# Patient Record
Sex: Male | Born: 1970 | Race: Black or African American | Hispanic: No | Marital: Married | Smoking: Never smoker
Health system: Southern US, Community
[De-identification: ages and names within clinical notes are randomized; demographics above are authoritative.]

## PROBLEM LIST (undated history)

## (undated) DIAGNOSIS — D573 Sickle-cell trait: Secondary | ICD-10-CM

## (undated) DIAGNOSIS — R7301 Impaired fasting glucose: Secondary | ICD-10-CM

## (undated) DIAGNOSIS — D72819 Decreased white blood cell count, unspecified: Secondary | ICD-10-CM

## (undated) DIAGNOSIS — G4730 Sleep apnea, unspecified: Secondary | ICD-10-CM

## (undated) DIAGNOSIS — I119 Hypertensive heart disease without heart failure: Secondary | ICD-10-CM

## (undated) DIAGNOSIS — I10 Essential (primary) hypertension: Secondary | ICD-10-CM

## (undated) DIAGNOSIS — N529 Male erectile dysfunction, unspecified: Secondary | ICD-10-CM

## (undated) DIAGNOSIS — E876 Hypokalemia: Secondary | ICD-10-CM

## (undated) HISTORY — DX: Sleep apnea, unspecified: G47.30

## (undated) HISTORY — DX: Sickle-cell trait: D57.3

## (undated) HISTORY — DX: Essential (primary) hypertension: I10

## (undated) HISTORY — PX: NO PAST SURGERIES: SHX2092

## (undated) HISTORY — DX: Decreased white blood cell count, unspecified: D72.819

## (undated) HISTORY — DX: Hypertensive heart disease without heart failure: I11.9

## (undated) HISTORY — DX: Hypokalemia: E87.6

## (undated) HISTORY — DX: Male erectile dysfunction, unspecified: N52.9

## (undated) HISTORY — DX: Impaired fasting glucose: R73.01

---

## 2003-08-20 ENCOUNTER — Emergency Department (HOSPITAL_COMMUNITY): Admission: EM | Admit: 2003-08-20 | Discharge: 2003-08-20 | Payer: Self-pay

## 2004-01-17 ENCOUNTER — Ambulatory Visit: Payer: Self-pay | Admitting: Internal Medicine

## 2004-01-27 ENCOUNTER — Ambulatory Visit: Payer: Self-pay | Admitting: Internal Medicine

## 2004-02-14 ENCOUNTER — Ambulatory Visit: Payer: Self-pay | Admitting: *Deleted

## 2004-11-19 ENCOUNTER — Ambulatory Visit: Payer: Self-pay | Admitting: Family Medicine

## 2004-12-15 ENCOUNTER — Ambulatory Visit: Payer: Self-pay | Admitting: Family Medicine

## 2004-12-30 ENCOUNTER — Ambulatory Visit: Payer: Self-pay | Admitting: Family Medicine

## 2005-01-27 ENCOUNTER — Ambulatory Visit: Payer: Self-pay | Admitting: Family Medicine

## 2005-03-26 ENCOUNTER — Ambulatory Visit: Payer: Self-pay | Admitting: Family Medicine

## 2005-06-04 ENCOUNTER — Ambulatory Visit: Payer: Self-pay | Admitting: Family Medicine

## 2005-06-07 ENCOUNTER — Ambulatory Visit: Payer: Self-pay | Admitting: Family Medicine

## 2007-01-30 DIAGNOSIS — E119 Type 2 diabetes mellitus without complications: Secondary | ICD-10-CM | POA: Insufficient documentation

## 2007-01-30 DIAGNOSIS — N289 Disorder of kidney and ureter, unspecified: Secondary | ICD-10-CM | POA: Insufficient documentation

## 2007-01-30 DIAGNOSIS — J3089 Other allergic rhinitis: Secondary | ICD-10-CM | POA: Insufficient documentation

## 2007-01-30 DIAGNOSIS — I10 Essential (primary) hypertension: Secondary | ICD-10-CM | POA: Insufficient documentation

## 2009-03-31 ENCOUNTER — Ambulatory Visit: Payer: Self-pay | Admitting: Hematology & Oncology

## 2009-04-09 LAB — CBC WITH DIFFERENTIAL (CANCER CENTER ONLY)
BASO%: 0.5 % (ref 0.0–2.0)
EOS%: 7.5 % — ABNORMAL HIGH (ref 0.0–7.0)
MCH: 27.7 pg — ABNORMAL LOW (ref 28.0–33.4)
MCHC: 33.9 g/dL (ref 32.0–35.9)
MONO#: 0.2 10*3/uL (ref 0.1–0.9)
NEUT#: 0.9 10*3/uL — ABNORMAL LOW (ref 1.5–6.5)
RBC: 5.25 10*6/uL (ref 4.20–5.70)
WBC: 2.4 10*3/uL — ABNORMAL LOW (ref 4.0–10.0)

## 2009-04-11 LAB — RHEUMATOID FACTOR: Rhuematoid fact SerPl-aCnc: <20 IU/mL (ref 0–20)

## 2009-04-11 LAB — VITAMIN B12: Vitamin B-12: 742 pg/mL (ref 211–911)

## 2009-04-11 LAB — ANTI-NUCLEAR AB-TITER (ANA TITER): ANA Titer 1: NEGATIVE

## 2011-11-03 ENCOUNTER — Telehealth: Payer: Self-pay | Admitting: Hematology & Oncology

## 2011-11-03 NOTE — Telephone Encounter (Signed)
Received call from referring Patient needs to be seen again. She will fax labs to [place]. I left message on cell for patient to call for appointment and gave her 11-15-11 date

## 2011-11-15 ENCOUNTER — Telehealth: Payer: Self-pay | Admitting: Hematology & Oncology

## 2011-11-15 ENCOUNTER — Encounter: Payer: Self-pay | Admitting: Hematology & Oncology

## 2011-11-15 ENCOUNTER — Other Ambulatory Visit: Payer: Self-pay | Admitting: Lab

## 2011-11-15 NOTE — Telephone Encounter (Signed)
Pt aware of 12-03-11 appointment

## 2011-11-26 ENCOUNTER — Ambulatory Visit: Payer: Self-pay | Admitting: Hematology & Oncology

## 2011-11-26 ENCOUNTER — Other Ambulatory Visit: Payer: Self-pay | Admitting: Lab

## 2011-12-03 ENCOUNTER — Ambulatory Visit (HOSPITAL_BASED_OUTPATIENT_CLINIC_OR_DEPARTMENT_OTHER): Payer: BC Managed Care – PPO | Admitting: Medical

## 2011-12-03 ENCOUNTER — Telehealth: Payer: Self-pay | Admitting: Hematology & Oncology

## 2011-12-03 ENCOUNTER — Other Ambulatory Visit (HOSPITAL_BASED_OUTPATIENT_CLINIC_OR_DEPARTMENT_OTHER): Payer: BC Managed Care – PPO | Admitting: Lab

## 2011-12-03 VITALS — BP 129/77 | HR 58 | Temp 97.7°F | Ht 70.0 in | Wt 165.0 lb

## 2011-12-03 DIAGNOSIS — D72819 Decreased white blood cell count, unspecified: Secondary | ICD-10-CM

## 2011-12-03 LAB — CBC WITH DIFFERENTIAL (CANCER CENTER ONLY)
EOS%: 1.1 % (ref 0.0–7.0)
MCH: 27.7 pg — ABNORMAL LOW (ref 28.0–33.4)
MCHC: 34.3 g/dL (ref 32.0–35.9)
NEUT#: 1.3 10*3/uL — ABNORMAL LOW (ref 1.5–6.5)
NEUT%: 48 % (ref 40.0–80.0)
Platelets: 177 10*3/uL (ref 145–400)
RBC: 5.23 10*6/uL (ref 4.20–5.70)
RDW: 13.2 % (ref 11.1–15.7)
WBC: 2.8 10*3/uL — ABNORMAL LOW (ref 4.0–10.0)

## 2011-12-03 LAB — CHCC SATELLITE - SMEAR

## 2011-12-03 NOTE — Telephone Encounter (Signed)
Pt called and change 12/03/11 appt time from 11am to 3pm.  RN was notified of appt time change

## 2011-12-06 NOTE — Progress Notes (Signed)
Patient Name : Daniel Barrera, Daniel Barrera MR #[id number] DOB: Oct 13, 1970 Encounter Date: 12/06/2011 Dictated by Eunice Blase, PA-C  Diagnoses: #1 leukopenia. #2 sickle cell trait.  Current therapy observation.  Interim history: Mr. Goyne was last seen back on 04/09/2009.  He presents today for an office followup visit.  Overall, he reports, that he's been doing very well.  We last saw him for leukopenia at which time his white count was 2.4 , hemoglobin 14.5, hematocrit 42.9, platelets 170,000 , his differential showed neutrophils 36.8 percent, lymphocytes, 48%, monocytes, 7.2%, eosinophils 7.5.  Overall, he's been feeling fairly well.  He has no complaints.  He's not having any problems with any recent or recurrent infections.  He's been checked for hepatitis, and HIV.  He is not reporting any rashes, joint aches or pains.  He does not report any change in bowel or bladder, habits.  He's not having any fevers, chills, or night sweats.  He does not report any unintentional weight loss or weight gain.  He does not report any nausea, vomiting, chest pain, shortness of breath.  Obvious, bleeding or swollen lymph nodes.  He has a good appetite and is eating well.  When we saw him last.  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  He does report that he has the sickle cell trait, which is a possibility.  He does not have any degree of anemia, his MCV is 81.  I'm not sure if, in the past.  He had a hemoglobin, electrophoresis to confirm that he truly does have sickle cell trait.  Overall, Mr. Steers reports, that he's been feeling fine without any problems.  Review of Systems: Pt. Denies any changes in their vision, hearing, adenopathy, fevers, chills, nausea, vomiting, diarrhea, constipation, chest pain, shortness of breath, passing blood, passing out, blacking out,  any changes in skin, joints, neurologic or psychiatric except as  noted.  Physical Exam: This is a pleasant, 41 year old, African American, male, in no obvious distress Vitals: Temperature 97.7 degrees, pulse 58, respirations 18, blood pressure 129/77, weight 165 pounds HEENT reveals a normocephalic, atraumatic skull, no scleral icterus, no oral lesions  Neck is supple without any cervical or supraclavicular adenopathy.  Lungs are clear to auscultation bilaterally. There are no wheezes, rales or rhonci Cardiac is regular rate and rhythm with a normal S1 and S2. There are no murmurs, rubs, or bruits.  Abdomen is soft with good bowel sounds there's a palpable mass. There is no palpable hepatosplenomegaly. There is no palpable fluid wave.  Musculoskeletal no tenderness of the spine, ribs, or hips.  Extremities there are no clubbing, cyanosis, or edema.  Skin no petechia, purpura or ecchymosis Neurologic is nonfocal.  Laboratory Data: White count 2.8, hemoglobin 14.5, hematocrit 42.3, platelets 177,000, neutrophils 1.3, lymphocytes, 1.1, monos 0.3, eosinophils, 0.0 basophil 0.0  Current Outpatient Prescriptions on File Prior to Visit  Medication Sig Dispense Refill  . EXFORGE 10-320 MG per tablet daily.       . hydrALAZINE (APRESOLINE) 50 MG tablet 2 (two) times daily.         Assessment/Plan: This is a pleasant, 41 year old, African American gentleman, with the following issues. #1 leukopenia-this really has not caused him any problems.  He is asymptomatic with this.  He has been checked for hepatitis, and HIV.  His immune system seems to be protecting him adequately at this time.  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  I do not see a need for a bone marrow test at the present time.  We will continue with observation.  #2 sickle cell trait-he does not have any degree of anemia, although his MCV is 81.  I suppose at some point down the road, a hemoglobin, electrophoresis.  May need to be done to confirm  this.  #3 followup-Mr. Hindes will follow back up with [place] in one year, but before then should there be questions or concerns.

## 2012-08-14 ENCOUNTER — Ambulatory Visit (INDEPENDENT_AMBULATORY_CARE_PROVIDER_SITE_OTHER): Payer: BC Managed Care – PPO | Admitting: Pulmonary Disease

## 2012-08-14 ENCOUNTER — Telehealth: Payer: Self-pay | Admitting: Pulmonary Disease

## 2012-08-14 ENCOUNTER — Encounter: Payer: Self-pay | Admitting: Pulmonary Disease

## 2012-08-14 VITALS — BP 128/62 | HR 61 | Temp 97.9°F | Ht 70.0 in | Wt 159.2 lb

## 2012-08-14 DIAGNOSIS — G4733 Obstructive sleep apnea (adult) (pediatric): Secondary | ICD-10-CM

## 2012-08-14 DIAGNOSIS — G4710 Hypersomnia, unspecified: Secondary | ICD-10-CM

## 2012-08-14 NOTE — Assessment & Plan Note (Signed)
The patient apparently has a diagnosis of obstructive sleep apnea, and is having persistent restless sleep and daytime sleepiness despite wearing CPAP.  Unfortunately, I do not have his studies available for review.  It is unclear whether we are adequately treating his sleep apnea, whether he may be having pressure induced central apneas, or whether his sleep issues have nothing to do with his sleep apnea.  He is describing difficulties with sleep onset, and perhaps this is the major issue.  He apparently has had a multiple sleep latency test, and we'll need to get the results of this as well.  Unfortunately, this is not an accurate study for narcolepsy or idiopathic hypersomnia if we are not treating his sleep apnea adequately, or if he is having prolonged periods of awakening at night.  We'll await the review of his sleep studies prior to making a decision.  More than likely will start with an automatic CPAP device for a 2 to three-week evaluation.

## 2012-08-14 NOTE — Progress Notes (Signed)
Subjective:    Patient ID: Daniel Barrera, male    DOB: 15-May-1971, 42 y.o.   MRN: [id number]  HPI  The patient is a 42 year old male who I have been asked to see for restless sleep and persistent non-restorative sleep.  He was diagnosed by his history last year with significant obstructive sleep apnea, however his study is not available for my review.  He underwent a titration study, and was started on CPAP at optimal pressure.  He uses a nasal CPAP mask, and denies an issue with mouth opening.  He has had issues with mask fitting.  Despite wearing CPAP, he has continued to have issues with sleep onset, and it may take up to 3 hours for him to fall asleep.  He will awaken 3-4 times a night, and does not feel rested in the mornings upon arising.  He has had persistent daytime sleepiness that greatly interferes with his work efficiency and quality of life.  He thinks the sleepiness and restless sleep has been going on for about 15 years, but denies this being an issue in young adulthood.  He has had a multiple sleep latency test, with the results being unknown.  He did not have as much of an issue with sleep onset prior to wearing CPAP.  He did have snoring, but it is unknown if he had an abnormal breathing pattern during sleep prior to CPAP.  He states that he has had no change in his weight over the last 2 years, and his Epworth score today is 7.  Sleep Questionnaire What time do you typically go to bed?( Between what hours) 9-10pm 9-10pm at 1151 on 08/14/12 by Nita Sells, CMA How long does it take you to fall asleep? 3hrs at times---pt states that some times he does not sleep at all during the night.  3hrs at times---pt states that some times he does not sleep at all during the night.  at 1151 on 08/14/12 by Nita Sells, CMA How many times during the night do you wake up? 4 4 at 1151 on 08/14/12 by Nita Sells, CMA What time do you get out of bed to start your day? 0700 0700 at 1151 on  08/14/12 by Nita Sells, CMA Do you drive or operate heavy machinery in your occupation? No No at 1151 on 08/14/12 by Nita Sells, CMA How much has your weight changed (up or down) over the past two years? (In pounds) 0 oz (0 kg) 0 oz (0 kg) at 1151 on 08/14/12 by Nita Sells, CMA Have you ever had a sleep study before? Yes Yes at 1151 on 08/14/12 by Nita Sells, CMA If yes, location of study? [facility] [place] [place] [facility] [place] [place] at 1151 on 08/14/12 by Nita Sells, CMA If yes, date of study? nov 2013 nov 2013 at 1151 on 08/14/12 by Nita Sells, CMA Do you currently use CPAP? Yes Yes at 1151 on 08/14/12 by Marjo Bicker Mabe, CMA If so, what pressure? 2 2 at 1151 on 08/14/12 by Marjo Bicker Mabe, CMA Do you wear oxygen at any time? No No at 1151 on 08/14/12 by Marjo Bicker Mabe, CMA   Review of Systems  Constitutional: Negative for fever and unexpected weight change.  HENT: Positive for nosebleeds ( patient states with sneezing and increased secretions with bloody mucus with CPAP) and sneezing. Negative for ear pain, congestion, sore throat, rhinorrhea, trouble swallowing, dental problem, postnasal  drip and sinus pressure.   Eyes: Negative for redness and itching.  Respiratory: Positive for shortness of breath. Negative for cough, chest tightness and wheezing.   Cardiovascular: Negative for palpitations and leg swelling.  Gastrointestinal: Negative for nausea and vomiting.  Genitourinary: Negative for dysuria.  Musculoskeletal: Negative for joint swelling.  Skin: Negative for rash.  Neurological: Negative for headaches.  Hematological: Does not bruise/bleed easily.  Psychiatric/Behavioral: Negative for dysphoric mood. The patient is not nervous/anxious.        Objective:   Physical Exam Constitutional:  Thin male, no acute distress  HENT:  Nares patent without discharge, mild crusting on right, deviated septum to left with narrowing.  Oropharynx  without exudate, palate and uvula are elongated, large tongue.   Eyes:  Perrla, eomi, no scleral icterus  Neck:  No JVD, no TMG  Cardiovascular:  Normal rate, regular rhythm, no rubs or gallops.  No murmurs        Intact distal pulses  Pulmonary :  Normal breath sounds, no stridor or respiratory distress   No rales, rhonchi, or wheezing  Abdominal:  Soft, nondistended, bowel sounds present.  No tenderness noted.   Musculoskeletal:  No lower extremity edema noted.  Lymph Nodes:  No cervical lymphadenopathy noted  Skin:  No cyanosis noted  Neurologic:  Alert, appropriate, moves all 4 extremities without obvious deficit.         Assessment & Plan:

## 2012-08-14 NOTE — Telephone Encounter (Signed)
lmtcb x1 for pt. 

## 2012-08-14 NOTE — Patient Instructions (Addendum)
Will discuss your sleep study results and further plans once I receive your data Work on the behavioral therapies we discussed to try and help sleep onset issues.

## 2012-08-15 NOTE — Telephone Encounter (Signed)
Spoke with Daniel Barrera and she said that they have already gotten copies of sleep studies and this is taken care of.

## 2012-08-23 ENCOUNTER — Telehealth: Payer: Self-pay | Admitting: Pulmonary Disease

## 2012-08-23 ENCOUNTER — Other Ambulatory Visit: Payer: Self-pay | Admitting: Pulmonary Disease

## 2012-08-23 ENCOUNTER — Encounter: Payer: Self-pay | Admitting: Pulmonary Disease

## 2012-08-23 DIAGNOSIS — G4733 Obstructive sleep apnea (adult) (pediatric): Secondary | ICD-10-CM

## 2012-08-23 NOTE — Telephone Encounter (Signed)
Please let pt know that we received his last 2 sleep studies, but still have not seen original.  Will try to get this.  In the meantime, would like to put him on auto device for 3 weeks to better understand what is going on with his sleep apnea.  Then will analyze the download.  I have sent an order to [facility].

## 2012-08-24 NOTE — Telephone Encounter (Signed)
Patient aware of results per [facility].  Patient c/o nose bleeds--per KC patient to increase humidity of device and let us know if the nose bleeds do not subside.  Explained to patient that I am currently working on getting his ORIGINAL sleep study from [facility]--once received Dr Shelle Iron will review then someone will contact him. Nothing further needed.

## 2012-08-29 ENCOUNTER — Other Ambulatory Visit: Payer: Self-pay | Admitting: Pulmonary Disease

## 2012-08-29 ENCOUNTER — Encounter: Payer: Self-pay | Admitting: Pulmonary Disease

## 2012-08-29 DIAGNOSIS — G4733 Obstructive sleep apnea (adult) (pediatric): Secondary | ICD-10-CM

## 2012-08-29 NOTE — Telephone Encounter (Signed)
LMOM x1 Sleep Study results received from [facility]---called to let pt know these have been received as well as recs per KC.  Patient needs to be made aware that order has been sent to [facility] for auto setting x 3 weeks to optimize pressure.

## 2012-08-29 NOTE — Telephone Encounter (Signed)
Original Sleep Study has been received and given to [facility] to review.   Please advise, pt still awaiting our call. Thanks.

## 2012-08-29 NOTE — Telephone Encounter (Signed)
Let him know that I have sent an order to [facility] to have his equipment company put hin on an auto setting for 3 weeks to optimize his pressure.  His current pressure seems low for the degree of his sleep apnea.  Will call him once I see his 3 week download.

## 2012-09-07 ENCOUNTER — Telehealth: Payer: Self-pay | Admitting: Pulmonary Disease

## 2012-09-07 NOTE — Telephone Encounter (Signed)
Pt states that CPAP machine pressure seems to be changing--different every time he uses it. I looked into his settings and he is on AUTO--I explained to him that this is why he is feeling the pressure constantly change. Pt states that he is having a hard time tolerating this constant change and now he is beginning to have multiple problems.   Pt c/o nasal congestion, nosebleeds, back pain and chest pain---pt state that this only happens when wearing the CPAP. I offered pt an appt with MW since he was having so much discomfort (chest pain and back pain) but pt refused--wants to wait on Dr Shelle Iron to help him tomorrow. I explained to pt that Dr Shelle Iron will receive this message int the AM and someone will call him back.   Dr Shelle Iron please advise. Thanks.

## 2012-09-08 ENCOUNTER — Other Ambulatory Visit: Payer: Self-pay | Admitting: Pulmonary Disease

## 2012-09-08 DIAGNOSIS — G4733 Obstructive sleep apnea (adult) (pediatric): Secondary | ICD-10-CM

## 2012-09-08 NOTE — Telephone Encounter (Signed)
lmomtcb x1 for pt 

## 2012-09-08 NOTE — Telephone Encounter (Signed)
Let pt know to be sure and turn up heat on humidifier if he is not getting enough moisture which can dry out nose and cause bleeding.  Also, get some nasal saline spray at drugstore, and use all during the day to keep nose moist.   Regarding his pressure cycling, will send order to dme to get download off his machine to see if we can get set on his optimal pressure.  Try to stay with auto for a little longer until this can be done.  Regarding his back and chest discomfort, he should see his primary md to see what is going on.

## 2012-09-11 NOTE — Telephone Encounter (Signed)
Spoke with patient, made him aware of recs as listed below per Dr. Shelle Iron. Verbalized understanding and nothing further needed at this time.

## 2012-10-02 ENCOUNTER — Telehealth: Payer: Self-pay | Admitting: Pulmonary Disease

## 2012-10-02 NOTE — Telephone Encounter (Signed)
Please advise if you have reviewed CPAP download thanks

## 2012-10-02 NOTE — Telephone Encounter (Signed)
Requested D/L from [facility]. Will give to [facility] once received.

## 2012-10-02 NOTE — Telephone Encounter (Signed)
Have not seen his downloads.

## 2012-10-02 NOTE — Telephone Encounter (Signed)
Will forward to [place] to track down results per triage protocol

## 2012-10-03 NOTE — Telephone Encounter (Signed)
lmomtcb x1 for pt 

## 2012-10-03 NOTE — Telephone Encounter (Signed)
Please let pt know that I have gotten all of his records, and have reviewed his download.  This shows great control of his sleep apnea with a pressure of 9-10cm.  His home machine is apparently set at 10, and should be adequate.  He has no mask leak.  I think his sleep apnea is being well controlled, and probably isn't the cause of his restless sleep.  Would look into sleep environment and see if anything needs to be addressed ( sounds, light, mattress, pillow, bed partner, pets, tv, etc.)  Would not change anything with his cpap if his machine is set on 10.

## 2012-10-03 NOTE — Telephone Encounter (Signed)
Form received and placed in Dr Shelle Iron folder for review.

## 2012-10-04 NOTE — Telephone Encounter (Signed)
lmomtcb on pt's home # I called pt's cell #, spoke with him.  States he is currently in a msg.  He would like to call back when he gets out of this.   Will await call back.

## 2012-10-05 NOTE — Telephone Encounter (Signed)
Pt advised. Jennifer Castillo, CMA  

## 2012-10-19 ENCOUNTER — Telehealth: Payer: Self-pay | Admitting: Pulmonary Disease

## 2012-10-19 DIAGNOSIS — G4733 Obstructive sleep apnea (adult) (pediatric): Secondary | ICD-10-CM

## 2012-10-19 NOTE — Telephone Encounter (Signed)
I spoke with pt and he stated he is feeling very tired/sleepy during the day. He has done everything KC has recommended. No TV in bed and all the lights are OFF. He wears his CPAP every night for as along as he sleeps. He believes the pressure is set at 10. He is requesting further recs. Please advise KC thanks

## 2012-10-20 NOTE — Telephone Encounter (Signed)
Spoke with patient-- patient has been scheduled to see Dr. Shelle Iron July 11 @ 130pm (after 4 week download) Patient has phillips cpap and is serviced with [facility] Patient aware order has been placed for 4 week download and patient aware appt is 4 weeks out so this download can be done and received by Dr. Shelle Iron. Nothing further needed at this time

## 2012-10-20 NOTE — Telephone Encounter (Signed)
I would like to see pt back in office to discuss with him Have him bring his machine so we can do a download.  If he does not have a resmed machine, will need to have his dme get a 4 week download off his machine and send to [place] before the visit.

## 2012-11-06 ENCOUNTER — Telehealth: Payer: Self-pay | Admitting: Pulmonary Disease

## 2012-11-06 NOTE — Telephone Encounter (Signed)
LMTCB for the pt 

## 2012-11-07 NOTE — Telephone Encounter (Signed)
Spoke with pt and he reports still having trouble falling asleep and going back to sleep.  He reports that he only used download machine for 1 week and then stopped due to nosebleeds.  Instructed pt that Dr Shelle Iron needs to have that complete 4 week download to help assess his pressure needs.  Pt states that he will retry the download machine.  Spoke with Larita Fife at [facility] and she will have therapist call pt to see if they can help him with nosebleed issues.  Pt will call our office and reschedule ov with Dr Shelle Iron for after the download is complete.

## 2012-11-24 ENCOUNTER — Telehealth: Payer: Self-pay | Admitting: Pulmonary Disease

## 2012-11-24 ENCOUNTER — Ambulatory Visit: Payer: BC Managed Care – PPO | Admitting: Pulmonary Disease

## 2012-11-24 NOTE — Telephone Encounter (Signed)
I spoke with the pt and he wanted to know why he was contacted about an appt today when it was supposed to be cancelled. I apologized and advised the appt had not been cancelled so that is why he received the call. I assured him that the appt has been cancelled and he will not be charged. The pt is using his cpap so we can get the 4 week download. He will call once the download is done to set an appt. Carron Curie, CMA

## 2012-12-01 ENCOUNTER — Encounter: Payer: Self-pay | Admitting: Hematology & Oncology

## 2012-12-01 ENCOUNTER — Ambulatory Visit (HOSPITAL_BASED_OUTPATIENT_CLINIC_OR_DEPARTMENT_OTHER): Payer: BC Managed Care – PPO | Admitting: Hematology & Oncology

## 2012-12-01 ENCOUNTER — Other Ambulatory Visit (HOSPITAL_BASED_OUTPATIENT_CLINIC_OR_DEPARTMENT_OTHER): Payer: BC Managed Care – PPO | Admitting: Lab

## 2012-12-01 VITALS — BP 127/59 | HR 59 | Temp 98.7°F | Resp 18 | Ht 70.0 in | Wt 160.0 lb

## 2012-12-01 DIAGNOSIS — D72819 Decreased white blood cell count, unspecified: Secondary | ICD-10-CM

## 2012-12-01 HISTORY — DX: Decreased white blood cell count, unspecified: D72.819

## 2012-12-01 LAB — CBC WITH DIFFERENTIAL (CANCER CENTER ONLY)
EOS%: 0.5 % (ref 0.0–7.0)
HCT: 43.7 % (ref 38.7–49.9)
HGB: 14.7 g/dL (ref 13.0–17.1)
LYMPH#: 0.9 10*3/uL (ref 0.9–3.3)
LYMPH%: 39.1 % (ref 14.0–48.0)
MCHC: 33.6 g/dL (ref 32.0–35.9)
MCV: 81 fL — ABNORMAL LOW (ref 82–98)
MONO%: 14.1 % — ABNORMAL HIGH (ref 0.0–13.0)
NEUT%: 45.8 % (ref 40.0–80.0)
Platelets: 169 10*3/uL (ref 145–400)
RBC: 5.39 10*6/uL (ref 4.20–5.70)
RDW: 13.1 % (ref 11.1–15.7)

## 2012-12-01 LAB — COMPREHENSIVE METABOLIC PANEL
CO2: 28 mEq/L (ref 19–32)
Calcium: 9.7 mg/dL (ref 8.4–10.5)
Glucose, Bld: 105 mg/dL — ABNORMAL HIGH (ref 70–99)
Total Bilirubin: 0.4 mg/dL (ref 0.3–1.2)

## 2012-12-01 NOTE — Progress Notes (Signed)
This office note has been dictated.

## 2012-12-02 NOTE — Progress Notes (Signed)
CC:   Jackie Plum, M.D.  DIAGNOSES: 1. 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 2. Sickle cell trait. 3. Sleep apnea.  CURRENT THERAPY:  Observation.  INTERIM HISTORY:  Daniel Barrera comes in for a followup.  We last saw him a year ago.  He has been doing okay outside of the fact that his sleep apnea machine is not working right.  As such, he has not been able to sleep well.  His changes in medications since we last saw him include the fact that he is no longer on Exforge.  He is only taking Apresoline.  He has had no bleeding.  He has had no nausea or vomiting.  He has had no rashes.  He has had no fever.  He got through the wintertime without any kind of infections.  PHYSICAL EXAM:  General:  This is a well-developed, well-nourished African American gentleman in no obvious distress.  Vital Signs: Temperature of 98.7, pulse 59, respiratory rate 18, blood pressure 127/59.  Weight is 160.  Head and Neck:  Show a normocephalic, atraumatic skull.  There are no ocular or oral lesions.  There are no palpable cervical or supraclavicular lymph nodes.  Lungs:  Clear bilaterally.  Cardiac:  Regular rate and rhythm with a normal S1, S2. There are no murmurs, rubs, or bruits.  Abdomen:  Soft with good bowel sounds.  There is no palpable abdominal mass.  There is no palpable hepatosplenomegaly.  Extremities:  Show no clubbing, cyanosis, or edema. Skin:  Shows no rashes, ecchymoses, or petechia.  LABORATORY STUDIES:  White cell count is 2.2.  Hemoglobin 14.7, hematocrit 43.7, platelet count 169.  MCV is 81.  White cell differential shows 46 segs, 39 lymphs, 14 monos.  Peripheral smear shows good maturation of his white blood cells.  I do not see any immature myeloid or lymphoid cells.  There are no atypical lymphocytes.  He has no nucleated red blood cells.  There might be a couple target cells.  I see no rouleaux formation.  IMPRESSION:  Daniel Barrera is a 42 year old African American  gentleman.  He has leukopenia.  This could be multifactorial.  He is on hydralazine which could cause some leukopenia.  He is also ANA positive.  Again, this might be an effect from the hydralazine.  I think most of the leukopenia is ethnic associated.  He is from [place].  I do not see any problems with respect to a hematologic malignancy given his blood smear looking so "bland."  I do want to see him back in about 3 months' time.  I think that if the white cell count continues to drop, then we are going to have to do a bone marrow on him.    ______________________________ Josph Macho, M.D. PRE/MEDQ  D:  12/01/2012  T:  12/02/2012  Job:  [id number]

## 2013-01-22 ENCOUNTER — Telehealth: Payer: Self-pay | Admitting: Pulmonary Disease

## 2013-01-22 NOTE — Telephone Encounter (Signed)
I spoke with pt. He needed to come in and see KC to discuss his sleep. He is schedule to see [facility] tomorrow 10:45. Nothing further needed

## 2013-01-23 ENCOUNTER — Ambulatory Visit (INDEPENDENT_AMBULATORY_CARE_PROVIDER_SITE_OTHER): Payer: BC Managed Care – PPO | Admitting: Pulmonary Disease

## 2013-01-23 ENCOUNTER — Encounter: Payer: Self-pay | Admitting: Pulmonary Disease

## 2013-01-23 VITALS — BP 120/72 | HR 60 | Temp 99.0°F | Ht 71.0 in | Wt 165.8 lb

## 2013-01-23 DIAGNOSIS — G4700 Insomnia, unspecified: Secondary | ICD-10-CM

## 2013-01-23 DIAGNOSIS — F5104 Psychophysiologic insomnia: Secondary | ICD-10-CM

## 2013-01-23 DIAGNOSIS — G4733 Obstructive sleep apnea (adult) (pediatric): Secondary | ICD-10-CM

## 2013-01-23 MED ORDER — TRAZODONE HCL 50 MG PO TABS: ORAL_TABLET | ORAL | Status: DC

## 2013-01-23 NOTE — Progress Notes (Signed)
  Subjective:    Patient ID: Daniel Barrera, male    DOB: 1970/08/15, 42 y.o.   MRN: [id number]  HPI The patient comes in today for followup of his multiple sleep issues.  He has severe obstructive sleep apnea as well as chronic insomnia, and it has been difficult to get him treated.  He currently is on CPAP with an automatic setting, and has downloaded today shows a good effort to try and wear her CPAP consistently.  There were no significant mask leaks, and no significant breakthrough events.  The patient continues to have issues with sleep onset as well as maintenance, but has not stuck with the behavioral therapy that I have outlined on multiple visits.  I have stressed to him again the importance of this.   Review of Systems  Constitutional: Positive for fatigue. Negative for fever and unexpected weight change.  HENT: Negative for ear pain, nosebleeds, congestion, sore throat, rhinorrhea, sneezing, trouble swallowing, dental problem, postnasal drip and sinus pressure.   Eyes: Negative for redness and itching.  Respiratory: Negative for cough, chest tightness, shortness of breath and wheezing.   Cardiovascular: Negative for palpitations and leg swelling.  Gastrointestinal: Negative for nausea and vomiting.  Genitourinary: Negative for dysuria.  Musculoskeletal: Negative for joint swelling.  Skin: Negative for rash.  Neurological: Negative for headaches.  Hematological: Does not bruise/bleed easily.  Psychiatric/Behavioral: Negative for dysphoric mood. The patient is not nervous/anxious.        Objective:   Physical Exam Thin male in no acute distress Nose without purulence or discharge noted No skin breakdown or pressure necrosis from the CPAP mask Neck without lymphadenopathy or thyromegaly Lower extremities without edema, no cyanosis The patient appears very sleepy, but appropriate.  Moves all 4 extremities without deficit       Assessment & Plan:

## 2013-01-23 NOTE — Assessment & Plan Note (Signed)
The patient has long-standing chronic insomnia, and unfortunately did not stick to the behavioral therapies that I have outlined that multiple visits.  I am willing to give him a short trial of trazodone so that he can wear CPAP consistently while trying to sleep.  Hopefully this will break the cycle of his insomnia.  However, I have stressed to him that he must do the behavioral therapy on a consistent basis in order to improve.  If he continues to have issues, I would recommend referral to a behavioral therapist for CBT.

## 2013-01-23 NOTE — Addendum Note (Signed)
Addended by: Maisie Fus on: 01/23/2013 11:33 AM   Modules accepted: Orders

## 2013-01-23 NOTE — Patient Instructions (Addendum)
Stay on cpap, but will have your pressure changed to a fixed pressure that may be more comfortable for you. Will try trazodone 50mg  about an hour before bedtime for 2 weeks only.  But remember, you still need to work on the behavioral therapies we discussed or this will not improve. If you continue to have issues with sleep onset, will make a recommendation to your primary doctor to refer to behavioral therapist for CBT (cognitive behavioral therapy). Please call me in 2-3 weeks with your response to the trazodone.

## 2013-01-23 NOTE — Assessment & Plan Note (Signed)
The patient has a history of severe obstructive sleep apnea, and at least by his download is making an effort to wear her almost every night.  He does not like the auto pressure setting, and we'll therefore put on a fixed pressure based on his download.  I have explained to the patient that his insomnia will not resolve that we did not adequately treat his sleep apnea.

## 2013-02-12 NOTE — Progress Notes (Signed)
This encounter was created in error - please disregard.

## 2013-03-02 ENCOUNTER — Ambulatory Visit: Payer: BC Managed Care – PPO | Admitting: Hematology & Oncology

## 2013-03-02 ENCOUNTER — Other Ambulatory Visit: Payer: BC Managed Care – PPO | Admitting: Lab

## 2013-03-22 ENCOUNTER — Other Ambulatory Visit: Payer: Self-pay

## 2013-05-18 ENCOUNTER — Telehealth: Payer: Self-pay | Admitting: Hematology & Oncology

## 2013-05-18 NOTE — Telephone Encounter (Signed)
Pt called made 1-22 appointment from oct no show. He wanted to be seen today, I transferred to RN and told pt to leave message if they don't answer.

## 2013-06-07 ENCOUNTER — Telehealth: Payer: Self-pay | Admitting: Hematology & Oncology

## 2013-06-07 ENCOUNTER — Ambulatory Visit (HOSPITAL_BASED_OUTPATIENT_CLINIC_OR_DEPARTMENT_OTHER): Payer: BC Managed Care – PPO | Admitting: Hematology & Oncology

## 2013-06-07 ENCOUNTER — Encounter: Payer: Self-pay | Admitting: Hematology & Oncology

## 2013-06-07 ENCOUNTER — Other Ambulatory Visit (HOSPITAL_BASED_OUTPATIENT_CLINIC_OR_DEPARTMENT_OTHER): Payer: BC Managed Care – PPO | Admitting: Lab

## 2013-06-07 VITALS — BP 138/72 | HR 77 | Temp 98.2°F | Resp 18 | Ht 69.0 in | Wt 167.0 lb

## 2013-06-07 DIAGNOSIS — D72819 Decreased white blood cell count, unspecified: Secondary | ICD-10-CM

## 2013-06-07 DIAGNOSIS — D573 Sickle-cell trait: Secondary | ICD-10-CM

## 2013-06-07 HISTORY — DX: Sickle-cell trait: D57.3

## 2013-06-07 LAB — CBC WITH DIFFERENTIAL (CANCER CENTER ONLY)
BASO#: 0 10*3/uL (ref 0.0–0.2)
BASO%: 0.4 % (ref 0.0–2.0)
EOS%: 1.6 % (ref 0.0–7.0)
Eosinophils Absolute: 0 10*3/uL (ref 0.0–0.5)
HCT: 40.6 % (ref 38.7–49.9)
HEMOGLOBIN: 13.5 g/dL (ref 13.0–17.1)
LYMPH#: 0.8 10*3/uL — AB (ref 0.9–3.3)
LYMPH%: 33.1 % (ref 14.0–48.0)
MCH: 26.9 pg — AB (ref 28.0–33.4)
MCHC: 33.3 g/dL (ref 32.0–35.9)
MCV: 81 fL — ABNORMAL LOW (ref 82–98)
MONO#: 0.3 10*3/uL (ref 0.1–0.9)
MONO%: 12.9 % (ref 0.0–13.0)
NEUT%: 52 % (ref 40.0–80.0)
NEUTROS ABS: 1.3 10*3/uL — AB (ref 1.5–6.5)
Platelets: 169 10*3/uL (ref 145–400)
RBC: 5.02 10*6/uL (ref 4.20–5.70)
RDW: 13.4 % (ref 11.1–15.7)
WBC: 2.5 10*3/uL — ABNORMAL LOW (ref 4.0–10.0)

## 2013-06-07 NOTE — Progress Notes (Signed)
This office note has been dictated.

## 2013-06-07 NOTE — Telephone Encounter (Signed)
Per inbasket schedule follow up in 6 months (7-23) pt scheduled for 8-4

## 2013-06-09 NOTE — Progress Notes (Signed)
CC:   Jackie PlumGeorge Barrera, M.D.  DIAGNOSES: 1. 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 2. Sickle cell trait.  CURRENT THERAPY:  Observation.  INTERIM HISTORY:  Mr. Daniel Barrera comes in for followup.  We last saw him back in July.  Since then, he has been doing well.  He was in [place] for 4 months for a training program.  He does work for [employer].  He has had no problems with bleeding or bruising.  He has had no fever. He has had no issues with sickle cell trait.  PHYSICAL EXAMINATION:  General:  This is a well-developed, well- nourished African American gentleman in no obvious distress.  Vital Signs:  Temperature of 98.2, pulse 77, respiratory rate 18, blood pressure 138/72, and weight is 167 pounds.  Head and Neck: Normocephalic, atraumatic skull.  There are no ocular or oral lesions. There are no palpable cervical or supraclavicular lymph nodes.  Lungs: Clear bilaterally.  Cardiac:  Regular rate and rhythm with a normal S1 and S2.  There are no murmurs, rubs, or bruits.  Abdomen:  Soft.  He has good bowel sounds.  There is no fluid wave.  There is no palpable abdominal mass.  There is no palpable hepatosplenomegaly.  Back:  No tenderness over the spine, ribs, or hips.  Extremities:  No clubbing, cyanosis, or edema.  Neurological:  No focal neurological deficits.  LABORATORY STUDIES:  White cell count is 2.5, hemoglobin 13.5, hematocrit 40.6, platelet count 169.  MCV is 81.  White cell differential shows 52 segs, 33 lymphocytes, 13 monos.  Peripheral smear shows some mild anisocytosis and poikilocytosis.  He has no nucleated red cells.  There may be a rare target cell.  I saw no rouleaux formation.  There was no polychromasia.  White cells appear normal in morphology and maturation.  He may have a slight decrease in the number of white cells.  They appear to be mature.  There are no hypersegmented polys.  I see no atypical lymphocytes.  Platelets are adequate in  number and size.  IMPRESSION:  Mr. Daniel Barrera is a nice 43 year old African American gentleman.  He has leukopenia.  I have to believe that this is ethnic- associated leukopenia.  He also has sickle cell trait.  I told him to make sure he takes folic acid.  I told him to take over-the-counter folic acid.  Again, I do not see any issues with his leukopenia.  He is asymptomatic with it.  I do not think this will ever cause him any problems.  I reassured them of this.  He wants to come back to see us in 6 months.  I think this would be reasonable.    ______________________________ [name], M.D. PRE/MEDQ  D:  06/08/2013  T:  06/09/2013  Job:  [id number]

## 2013-06-27 ENCOUNTER — Ambulatory Visit: Payer: BC Managed Care – PPO | Admitting: Pulmonary Disease

## 2013-06-28 ENCOUNTER — Encounter: Payer: Self-pay | Admitting: Pulmonary Disease

## 2013-06-28 ENCOUNTER — Ambulatory Visit (INDEPENDENT_AMBULATORY_CARE_PROVIDER_SITE_OTHER): Payer: BC Managed Care – PPO | Admitting: Pulmonary Disease

## 2013-06-28 VITALS — BP 138/62 | HR 79 | Temp 98.3°F | Ht 70.0 in | Wt 163.8 lb

## 2013-06-28 DIAGNOSIS — G4700 Insomnia, unspecified: Secondary | ICD-10-CM

## 2013-06-28 DIAGNOSIS — G4733 Obstructive sleep apnea (adult) (pediatric): Secondary | ICD-10-CM

## 2013-06-28 DIAGNOSIS — F5104 Psychophysiologic insomnia: Secondary | ICD-10-CM

## 2013-06-28 NOTE — Patient Instructions (Signed)
Continue with cpap, and keep up with mask changes and supplies. Can take melatonin 3-5 mg about 4 hrs before bedtime. Try and stick to the behavioral therapies I have outlined in the past. Will refer you to a behavioral therapist for cognitive behavioral therapy.  Please give me some feedback regarding how this goes.

## 2013-06-28 NOTE — Progress Notes (Signed)
   Subjective:    Patient ID: Daniel Barrera, male    DOB: 1970/07/06, 43 y.o.   MRN: [id number]  HPI The patient comes in today for followup of his obstructive sleep apnea. He is wearing CPAP compliantly, and is having no issues with the actual mask or pressure. He has had downloaded recently which showed adequate control of his apnea and adequate compliance. His main complaint is that of ongoing chronic insomnia. I have discussed with him various behavioral therapies such as stimulus control and also what constitutes good sleep hygiene. He really has not stayed compliant with this. He is asking about a sleeping medication, and I have stressed to him again to light to avoid this if at all possible. He has never been evaluated by behavioral psychology.   Review of Systems  Constitutional: Negative for fever and unexpected weight change.  HENT: Negative for congestion, dental problem, ear pain, nosebleeds, postnasal drip, rhinorrhea, sinus pressure, sneezing, sore throat and trouble swallowing.   Eyes: Negative for redness and itching.  Respiratory: Negative for cough, chest tightness, shortness of breath and wheezing.   Cardiovascular: Negative for palpitations and leg swelling.  Gastrointestinal: Negative for nausea and vomiting.  Genitourinary: Negative for dysuria.  Musculoskeletal: Negative for joint swelling.  Skin: Negative for rash.  Neurological: Negative for headaches.  Hematological: Does not bruise/bleed easily.  Psychiatric/Behavioral: Positive for sleep disturbance ( diff initiating/maintainging). Negative for dysphoric mood. The patient is not nervous/anxious.        Objective:   Physical Exam Thin male in no acute distress Nose without purulence or discharge noted No skin breakdown or pressure necrosis from the CPAP mask Neck without lymphadenopathy or thyromegaly Lower extremities without edema, no cyanosis Alert and oriented, moves all 4 extremities.       Assessment  & Plan:

## 2013-06-28 NOTE — Assessment & Plan Note (Signed)
The patient is wearing CPAP compliantly, and states he does not have any issues with his mask fit or pressure.

## 2013-06-28 NOTE — Assessment & Plan Note (Signed)
The patient continues to have issues with chronic insomnia, but has really not stuck with the sleep hygiene and behavioral therapies that I have recommended. I have stressed to him the importance of avoiding long-term sedative hypnotics for insomnia, and will refer him to a psychologist for cognitive behavioral therapy.

## 2013-07-20 ENCOUNTER — Telehealth: Payer: Self-pay | Admitting: Pulmonary Disease

## 2013-07-20 NOTE — Telephone Encounter (Signed)
I spoke with Daniel Barrera and she has called [facility] to check on status of appt and has had to leave messages at # [phone]. She is awaiting a call back from them. I have [id number] to advise the pt of this and provide him with the number so he can contact them as well. [name], CMA

## 2013-11-01 ENCOUNTER — Encounter: Payer: Self-pay | Admitting: Neurology

## 2013-11-05 ENCOUNTER — Institutional Professional Consult (permissible substitution): Payer: Self-pay | Admitting: Neurology

## 2013-11-09 ENCOUNTER — Institutional Professional Consult (permissible substitution): Payer: Self-pay | Admitting: Neurology

## 2013-11-09 ENCOUNTER — Ambulatory Visit (INDEPENDENT_AMBULATORY_CARE_PROVIDER_SITE_OTHER): Payer: BC Managed Care – PPO | Admitting: Neurology

## 2013-11-09 ENCOUNTER — Encounter: Payer: Self-pay | Admitting: Neurology

## 2013-11-09 VITALS — BP 136/79 | HR 45 | Temp 98.8°F | Ht 70.0 in | Wt 170.0 lb

## 2013-11-09 DIAGNOSIS — G4700 Insomnia, unspecified: Secondary | ICD-10-CM

## 2013-11-09 DIAGNOSIS — G4730 Sleep apnea, unspecified: Principal | ICD-10-CM

## 2013-11-09 DIAGNOSIS — Z9989 Dependence on other enabling machines and devices: Secondary | ICD-10-CM

## 2013-11-09 DIAGNOSIS — G4733 Obstructive sleep apnea (adult) (pediatric): Secondary | ICD-10-CM

## 2013-11-09 NOTE — Patient Instructions (Signed)
Please remember to try to maintain good sleep hygiene, which means: Keep a regular sleep and wake schedule, try not to exercise or have a meal within 2 hours of your bedtime, try to keep your bedroom conducive for sleep, that is, cool and dark, without light distractors such as an illuminated alarm clock, and refrain from watching TV right before sleep or in the middle of the night and do not keep the TV or radio on during the night. Also, try not to use or play on electronic devices at bedtime, such as your cell phone, tablet PC or laptop. If you like to read at bedtime on an electronic device, try to dim the background light as much as possible. Do not eat in the middle of the night.   Please consider going through with cognitive behavioral therapy as recommended by Dr. Shelle Ironlance. In addition, Dr. Shelle Ironlance may be open to trying you on an as needed sleep aid in low dose as an adjunct. I will right back to him, your PCP and Dr. Jacinto HalimGanji.

## 2013-11-09 NOTE — Progress Notes (Signed)
Subjective:    Patient ID: Daniel Barrera is a 43 y.o. male.  HPI     Daniel FoleySaima Athar, MD, PhD [facility] [address], [address] [address] [place], [place] [address]  Dear Dr. Julio Barrera,   I saw your patient, Daniel Barrera, upon your kind request in my neurologic clinic today for initial consultation of his sleep disorder, in particular his diagnosis of severe obstructive sleep apnea on CPAP treatment, for second opinion. The patient is unaccompanied today. As you know, Daniel Barrera is a very pleasant 43 year old right-handed gentleman who has an underlying medical history of hypertension, allergic rhinitis, left ventricular hypertrophy, prediabetes, vitamin D deficiency, leukopenia and sickle cell trait, who was diagnosed with severe obstructive sleep apnea and has been following with Dr. Shelle Barrera in pulmonology. I reviewed his sleep study report from 04/04/2012. His sleep efficiency was 83.9% with a latency to sleep of 19 minutes and REM latency prolonged at 250.5 minutes. He had a decreased percentage of REM sleep and an increased percentage of stage II sleep. Baseline oxygen saturation was 98% and nadir was 85%. He had a total AHI of 54.4 per hour, rising to 74.8 per hour in the supine position and 65.7 per hour in REM sleep. He had no significant cardiac arrhythmias or periodic leg movements of sleep during the study. This study was interpreted by his prior sleep specialist, Daniel Barrera. He had a CPAP titration study he was placed on CPAP therapy. He has had issues tolerating CPAP. However, he has tried to be compliant with CPAP treatment. He had a CPAP titration study on 07/14/2012 which showed resolution of his sleep apnea on a pressure of 10 cm. He also had a MSLT on 07/15/2012 which showed a mean sleep latency of 16.3 minutes with no sleep onset REM periods. He started seeing Dr. Shelle Barrera in 3/14. He was placed on AutoPap trial and as I reviewed prior records from Dr. Shelle Barrera, it  looks like treatment pressure of 9-10 cm was adequate in controlling the patient's sleep disorder breathing. The patient has had chronic issues with sleep onset and sleep maintenance and Dr. Shelle Barrera have advised him regarding improvement of his sleep hygiene and cognitive behavioral therapy for which [facility]  At this time, his main complaint is initiating and staying asleep, which has been going on for 18 months. He tried melatonin and Nyquil, but was discouraged to take 10-15 mg of melatonin by Dr. Shelle Barrera. He was also taking too much Nyquil.  He was not tried on prescription medicine and does not want to get dependent on it.  He goes to bed by 9 to 9:30 and may be asleep by 2-3 AM. He uses CPAP each night. His rise time is 6:30 AM and he does not feel rested.   His Past Medical History Is Significant For: Past Medical History  Diagnosis Date  . Sleep apnea   . HTN (hypertension)   . Impaired fasting glucose   . Hypopotassemia   . 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 Leukocytopenia, unspecified   . ED (erectile dysfunction)   . Leukopenia 12/01/2012  . Sickle-cell trait 06/07/2013    His Past Surgical History Is Significant For: Past Surgical History  Procedure Laterality Date  . No past surgeries      His Family History Is Significant For: Family History  Problem Relation Age of Onset  . Hypertension Mother     His Social History Is Significant  For: History   Social History  . Marital Status: Married    Spouse Name: N/A    Number of Children: N/A  . Years of Education: N/A   Occupational History  . Accountant    Social History Main Topics  . Smoking status: Never Smoker   . Smokeless tobacco: Never Used     Comment: never used  . Alcohol Use: Yes     Comment: ocassional  . Drug Use: No  . Sexual Activity: None   Other Topics Concern  . None   Social History Narrative  . None    His Allergies Are:  No Known  Allergies:   His Current Medications Are:  Outpatient Encounter Prescriptions as of 11/09/2013  Medication Sig  . AMLODIPINE BESYLATE PO Take 1 tablet by mouth daily.  Marland Kitchen aspirin 81 MG tablet Take 81 mg by mouth. Not taking as directed  . atenolol (TENORMIN) 100 MG tablet Take 1 tablet by mouth daily.  . hydrALAZINE (APRESOLINE) 50 MG tablet 50 mg 4 (four) times daily.   . Multiple Vitamins-Minerals (MEGA MULTIVITAMIN FOR MEN PO) Take by mouth every morning.  . valsartan (DIOVAN) 160 MG tablet Take 1 tablet by mouth daily.  . [DISCONTINUED] KLOR-CON M20 20 MEQ tablet Take 20 mEq by mouth as needed.   :  Review of Systems:  Out of a complete 14 point review of systems, all are reviewed and negative with the exception of these symptoms as listed below: Review of Systems  Constitutional: Positive for fatigue.  HENT: Negative.   Eyes: Negative.   Respiratory: Negative.   Cardiovascular: Negative.   Gastrointestinal: Negative.   Endocrine: Negative.   Genitourinary: Negative.   Musculoskeletal: Negative.   Skin: Negative.   Allergic/Immunologic: Negative.   Neurological: Negative.   Hematological: Negative.   Psychiatric/Behavioral: Positive for sleep disturbance (snoring, insomnia, e.d.s.).    Objective:  Neurologic Exam  Physical Exam Physical Examination:   Filed Vitals:   11/09/13 1032  BP: 136/79  Pulse: 45  Temp: 98.8 F (37.1 C)    General Examination: The patient is a very pleasant 43 y.o. male in no acute distress. He appears well-developed and well-nourished and well groomed. He is not obese.   HEENT: Normocephalic, atraumatic, pupils are equal, round and reactive to light and accommodation. Funduscopic exam is normal with sharp disc margins noted. Extraocular tracking is good without limitation to gaze excursion or nystagmus noted. Normal smooth pursuit is noted. Hearing is grossly intact. Tympanic membranes are clear bilaterally. Face is symmetric with normal  facial animation and normal facial sensation. Speech is clear with no dysarthria noted. There is no hypophonia. There is no lip, neck/head, jaw or voice tremor. Neck is supple with full range of passive and active motion. There are no carotid bruits on auscultation. Oropharynx exam reveals: mild mouth dryness, good dental hygiene and marked airway crowding, due to large tongue, and redundant soft palate. Mallampati is class III. Tongue protrudes centrally and palate elevates symmetrically. Tonsils are small. Neck size is 17 inches. He has a Mild overbite. Nasal inspection reveals no significant nasal mucosal bogginess or redness and no septal deviation.   Chest: Clear to auscultation without wheezing, rhonchi or crackles noted.  Heart: S1+S2+0, regular and normal without murmurs, rubs or gallops noted.   Abdomen: Soft, non-tender and non-distended with normal bowel sounds appreciated on auscultation.  Extremities: There is no pitting edema in the distal lower extremities bilaterally. Pedal pulses are intact.  Skin: Warm and dry  without trophic changes noted. There are no varicose veins.  Musculoskeletal: exam reveals no obvious joint deformities, tenderness or joint swelling or erythema.   Neurologically:  Mental status: The patient is awake, alert and oriented in all 4 spheres. His immediate and remote memory, attention, language skills and fund of knowledge are appropriate. There is no evidence of aphasia, agnosia, apraxia or anomia. Speech is clear with normal prosody and enunciation. Thought process is linear. Mood is normal and affect is normal.  Cranial nerves II - XII are as described above under HEENT exam. In addition: shoulder shrug is normal with equal shoulder height noted. Motor exam: Normal bulk, strength and tone is noted. There is no drift, tremor or rebound. Romberg is negative. Reflexes are 2+ throughout. Babinski: Toes are flexor bilaterally. Fine motor skills and coordination:  intact with normal finger taps, normal hand movements, normal rapid alternating patting, normal foot taps and normal foot agility.  Cerebellar testing: No dysmetria or intention tremor on finger to nose testing. Heel to shin is unremarkable bilaterally. There is no truncal or gait ataxia.  Sensory exam: intact to light touch, pinprick, vibration, temperature sense in the upper and lower extremities.  Gait, station and balance: He stands easily. No veering to one side is noted. No leaning to one side is noted. Posture is age-appropriate and stance is narrow based. Gait shows normal stride length and normal pace. No problems turning are noted. He turns en bloc. Tandem walk is unremarkable. Intact toe and heel stance is noted.               Assessment and Plan:   In summary, Daniel Barrera is a very pleasant 43 y.o.-year old male with an underlying medical history of hypertension, allergic rhinitis, left ventricular hypertrophy, prediabetes, vitamin D deficiency, leukopenia and sickle cell trait, who was diagnosed with severe obstructive sleep apnea and presents for second opinion for his inability to fall asleep and staying asleep. He has tried over-the-counter medications but did not have a good experience with Michael and melatonin. He was recently referred by his sleep doctor for cognitive behavioral therapy but did not follow through with it and went for just an evaluation. Today I had a long chat with the patient regarding his sleep studies, his severe sleep apnea and sleep maintenance issues. I talked to him about improving sleep hygiene and encouraged him to follow through with cognitive behavioral therapy. He is advised that the chances for success are higher if he goes and motivated. He is compliant with CPAP therapy and is commended for that. He is nonfocal on exam and I reassured him that regard. He is encouraged to talk to Dr. Shelle Barrera about potentially trying a sleep aid as an adjunct on an as-needed  basis. Furthermore he is encouraged to resume cognitive behavioral therapy. He demonstrated understanding and agreement.  I answered all his questions today and the patient was in agreement. I do not need to see him back on a regular basis as he is well established with Dr. Shelle Barrera. I advised him that I will copy his cardiologist and his sleep doctor on my note today.  Thank you very much for allowing me to participate in the care of this nice patient. If I can be of any further assistance to you please do not hesitate to call me at [phone].  Sincerely,   Daniel FoleySaima Athar, MD, PhD

## 2013-12-18 ENCOUNTER — Ambulatory Visit (HOSPITAL_BASED_OUTPATIENT_CLINIC_OR_DEPARTMENT_OTHER): Payer: BC Managed Care – PPO | Admitting: Hematology & Oncology

## 2013-12-18 ENCOUNTER — Other Ambulatory Visit (HOSPITAL_BASED_OUTPATIENT_CLINIC_OR_DEPARTMENT_OTHER): Payer: BC Managed Care – PPO | Admitting: Lab

## 2013-12-18 VITALS — BP 141/87 | HR 45 | Temp 97.8°F | Wt 169.0 lb

## 2013-12-18 DIAGNOSIS — D708 Other neutropenia: Secondary | ICD-10-CM

## 2013-12-18 DIAGNOSIS — D72819 Decreased white blood cell count, unspecified: Secondary | ICD-10-CM

## 2013-12-18 DIAGNOSIS — R718 Other abnormality of red blood cells: Secondary | ICD-10-CM

## 2013-12-18 DIAGNOSIS — D573 Sickle-cell trait: Secondary | ICD-10-CM

## 2013-12-18 LAB — IRON AND TIBC CHCC
%SAT: 29 % (ref 20–55)
IRON: 87 ug/dL (ref 42–163)
TIBC: 298 ug/dL (ref 202–409)
UIBC: 211 ug/dL (ref 117–376)

## 2013-12-18 LAB — CBC WITH DIFFERENTIAL (CANCER CENTER ONLY)
BASO#: 0 10*3/uL (ref 0.0–0.2)
BASO%: 0 % (ref 0.0–2.0)
EOS ABS: 0.1 10*3/uL (ref 0.0–0.5)
EOS%: 2.5 % (ref 0.0–7.0)
HCT: 41.5 % (ref 38.7–49.9)
HGB: 14.2 g/dL (ref 13.0–17.1)
LYMPH#: 1.1 10*3/uL (ref 0.9–3.3)
LYMPH%: 40.7 % (ref 14.0–48.0)
MCH: 27.9 pg — AB (ref 28.0–33.4)
MCHC: 34.2 g/dL (ref 32.0–35.9)
MCV: 82 fL (ref 82–98)
MONO#: 0.4 10*3/uL (ref 0.1–0.9)
MONO%: 12.7 % (ref 0.0–13.0)
NEUT%: 44.1 % (ref 40.0–80.0)
NEUTROS ABS: 1.2 10*3/uL — AB (ref 1.5–6.5)
PLATELETS: 159 10*3/uL (ref 145–400)
RBC: 5.09 10*6/uL (ref 4.20–5.70)
RDW: 12.9 % (ref 11.1–15.7)
WBC: 2.8 10*3/uL — AB (ref 4.0–10.0)

## 2013-12-18 LAB — FERRITIN CHCC: FERRITIN: 143 ng/mL (ref 22–316)

## 2013-12-18 LAB — RETICULOCYTES (CHCC)
ABS RETIC: 40.8 10*3/uL (ref 19.0–186.0)
RBC.: 5.1 MIL/uL (ref 4.22–5.81)
RETIC CT PCT: 0.8 % (ref 0.4–2.3)

## 2013-12-18 LAB — CHCC SATELLITE - SMEAR

## 2013-12-20 NOTE — Progress Notes (Signed)
Hematology and Oncology Follow Up Visit  Daniel Barrera [id number] May 28, 1970 43 y.o. 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  Daniel Barrera is doing well. We last saw back in January. He's working. He is traveling. He's had no problems infections. He's had no fevers. He's had no rashes. He's had no change in bowel or bladder habits. He's had no cough. He does not smoke. He does not drink. He's had no fatigue. I told him he needs to exercise more. Where he works, does have a gym.  Medications: Current outpatient prescriptions:AMLODIPINE BESYLATE PO, Take 1 tablet by mouth daily., Disp: , Rfl: ;  aspirin 81 MG tablet, Take 81 mg by mouth. Not taking as directed, Disp: , Rfl: ;  atenolol (TENORMIN) 100 MG tablet, Take 1 tablet by mouth daily., Disp: , Rfl: ;  Multiple Vitamins-Minerals (MEGA MULTIVITAMIN FOR MEN PO), Take by mouth every morning., Disp: , Rfl: ;  valsartan (DIOVAN) 160 MG tablet, Take 1 tablet by mouth daily., Disp: , Rfl:  hydrALAZINE (APRESOLINE) 50 MG tablet, 50 mg 4 (four) times daily. , Disp: , Rfl:   Allergies: No Known Allergies  Past Medical History, Surgical history, Social history, and Family History were reviewed and updated.  Review of Systems: As above  Physical Exam:  weight is 169 lb (76.658 kg). His oral temperature is 97.8 F (36.6 C). His blood pressure is 141/87 and his pulse is 45.   Well-developed and well-nourished African gentleman. Head and neck exam shows no ocular or oral lesions. There is no palpable cervical or supraclavicular lymph nodes. Lungs are clear. Cardiac exam regular in rhythm with no murmurs rubs or bruits. Abdomen is soft. He has good bowel sounds. There is no fluid wave. There is no palpable liver or spleen tip. Back exam no tenderness over the spine ribs or hips. Extremities shows no  clubbing cyanosis or edema. Skin exam no rashes, ecchymoses or petechia.  Lab Results  Component Value Date   WBC 2.8* 12/18/2013   HGB 14.2 12/18/2013   HCT 41.5 12/18/2013   MCV 82 12/18/2013   PLT 159 12/18/2013     Chemistry      Component Value Date/Time   NA 141 12/01/2012 1109   K 3.4* 12/01/2012 1109   CL 103 12/01/2012 1109   CO2 28 12/01/2012 1109   BUN 15 12/01/2012 1109   CREATININE 1.10 12/01/2012 1109      Component Value Date/Time   CALCIUM 9.7 12/01/2012 1109   ALKPHOS 69 12/01/2012 1109   AST 18 12/01/2012 1109   ALT 34 12/01/2012 1109   BILITOT 0.4 12/01/2012 1109         Impression and Plan: Mr. Daniel Barrera is a 43 year old gentleman with chronic leukopenia.  I looked at his blood smear. I do not see anything that looked suspicious. His white cells  were mature. There were no hypersegmented polys. He at no immature myeloid cells. Red cells showed some anisocytosis. There were no nucleated red cells.  I told him to continue the folic acid.  His white cell count continues to improve.   we will plan to get him back in the clinic in 6 months Again, I don't see anything on his blood smear or on his exam that would suggest an underlying hematologic malignancy or myelodysplasia.  I reviewed his  lab work with him. I explained what he had. I told him that he can always come back to see [place] in between visits if he needs to have his blood counts checked.   I spent about 25 minutes with him today.  I really don't think   Josph Macho, MD 8/6/20157:44 AM

## 2014-06-28 ENCOUNTER — Ambulatory Visit: Payer: BC Managed Care – PPO | Admitting: Family

## 2014-06-28 ENCOUNTER — Other Ambulatory Visit: Payer: BLUE CROSS/BLUE SHIELD | Admitting: Lab

## 2014-06-28 ENCOUNTER — Telehealth: Payer: Self-pay | Admitting: Hematology & Oncology

## 2014-06-28 NOTE — Telephone Encounter (Signed)
Talked with pt he said he would call back to reschedule appointment

## 2014-11-11 ENCOUNTER — Other Ambulatory Visit: Payer: Self-pay

## 2015-12-08 DIAGNOSIS — E119 Type 2 diabetes mellitus without complications: Secondary | ICD-10-CM | POA: Diagnosis not present

## 2015-12-08 DIAGNOSIS — I119 Hypertensive heart disease without heart failure: Secondary | ICD-10-CM | POA: Diagnosis not present

## 2015-12-08 DIAGNOSIS — I10 Essential (primary) hypertension: Secondary | ICD-10-CM | POA: Diagnosis not present

## 2015-12-08 DIAGNOSIS — G4733 Obstructive sleep apnea (adult) (pediatric): Secondary | ICD-10-CM | POA: Diagnosis not present

## 2016-03-08 DIAGNOSIS — E119 Type 2 diabetes mellitus without complications: Secondary | ICD-10-CM | POA: Diagnosis not present

## 2016-03-08 DIAGNOSIS — I119 Hypertensive heart disease without heart failure: Secondary | ICD-10-CM | POA: Diagnosis not present

## 2016-03-08 DIAGNOSIS — J309 Allergic rhinitis, unspecified: Secondary | ICD-10-CM | POA: Diagnosis not present

## 2016-03-08 DIAGNOSIS — I10 Essential (primary) hypertension: Secondary | ICD-10-CM | POA: Diagnosis not present

## 2016-03-08 DIAGNOSIS — G4733 Obstructive sleep apnea (adult) (pediatric): Secondary | ICD-10-CM | POA: Diagnosis not present

## 2016-04-09 DIAGNOSIS — F5104 Psychophysiologic insomnia: Secondary | ICD-10-CM | POA: Diagnosis not present

## 2016-04-09 DIAGNOSIS — G4733 Obstructive sleep apnea (adult) (pediatric): Secondary | ICD-10-CM | POA: Diagnosis not present

## 2016-04-09 DIAGNOSIS — Z9989 Dependence on other enabling machines and devices: Secondary | ICD-10-CM | POA: Diagnosis not present

## 2016-04-26 DIAGNOSIS — I10 Essential (primary) hypertension: Secondary | ICD-10-CM | POA: Diagnosis not present

## 2016-04-26 DIAGNOSIS — I119 Hypertensive heart disease without heart failure: Secondary | ICD-10-CM | POA: Diagnosis not present

## 2016-04-26 DIAGNOSIS — G4733 Obstructive sleep apnea (adult) (pediatric): Secondary | ICD-10-CM | POA: Diagnosis not present

## 2016-04-26 DIAGNOSIS — E119 Type 2 diabetes mellitus without complications: Secondary | ICD-10-CM | POA: Diagnosis not present

## 2016-04-30 DIAGNOSIS — E119 Type 2 diabetes mellitus without complications: Secondary | ICD-10-CM | POA: Diagnosis not present

## 2016-04-30 DIAGNOSIS — E559 Vitamin D deficiency, unspecified: Secondary | ICD-10-CM | POA: Diagnosis not present

## 2016-04-30 DIAGNOSIS — G4733 Obstructive sleep apnea (adult) (pediatric): Secondary | ICD-10-CM | POA: Diagnosis not present

## 2016-04-30 DIAGNOSIS — I10 Essential (primary) hypertension: Secondary | ICD-10-CM | POA: Diagnosis not present

## 2016-04-30 DIAGNOSIS — I119 Hypertensive heart disease without heart failure: Secondary | ICD-10-CM | POA: Diagnosis not present

## 2016-09-06 DIAGNOSIS — E119 Type 2 diabetes mellitus without complications: Secondary | ICD-10-CM | POA: Diagnosis not present

## 2016-09-06 DIAGNOSIS — I10 Essential (primary) hypertension: Secondary | ICD-10-CM | POA: Diagnosis not present

## 2016-09-06 DIAGNOSIS — G4733 Obstructive sleep apnea (adult) (pediatric): Secondary | ICD-10-CM | POA: Diagnosis not present

## 2016-09-06 DIAGNOSIS — Z01118 Encounter for examination of ears and hearing with other abnormal findings: Secondary | ICD-10-CM | POA: Diagnosis not present

## 2016-09-06 DIAGNOSIS — E559 Vitamin D deficiency, unspecified: Secondary | ICD-10-CM | POA: Diagnosis not present

## 2016-09-06 DIAGNOSIS — Z0000 Encounter for general adult medical examination without abnormal findings: Secondary | ICD-10-CM | POA: Diagnosis not present

## 2016-09-06 DIAGNOSIS — I119 Hypertensive heart disease without heart failure: Secondary | ICD-10-CM | POA: Diagnosis not present

## 2016-09-06 DIAGNOSIS — N400 Enlarged prostate without lower urinary tract symptoms: Secondary | ICD-10-CM | POA: Diagnosis not present

## 2016-12-06 DIAGNOSIS — I119 Hypertensive heart disease without heart failure: Secondary | ICD-10-CM | POA: Diagnosis not present

## 2016-12-06 DIAGNOSIS — E785 Hyperlipidemia, unspecified: Secondary | ICD-10-CM | POA: Diagnosis not present

## 2016-12-06 DIAGNOSIS — I10 Essential (primary) hypertension: Secondary | ICD-10-CM | POA: Diagnosis not present

## 2016-12-06 DIAGNOSIS — E119 Type 2 diabetes mellitus without complications: Secondary | ICD-10-CM | POA: Diagnosis not present

## 2017-01-07 DIAGNOSIS — E119 Type 2 diabetes mellitus without complications: Secondary | ICD-10-CM | POA: Diagnosis not present

## 2017-01-07 DIAGNOSIS — I119 Hypertensive heart disease without heart failure: Secondary | ICD-10-CM | POA: Diagnosis not present

## 2017-01-07 DIAGNOSIS — E785 Hyperlipidemia, unspecified: Secondary | ICD-10-CM | POA: Diagnosis not present

## 2017-01-07 DIAGNOSIS — I10 Essential (primary) hypertension: Secondary | ICD-10-CM | POA: Diagnosis not present

## 2017-03-07 DIAGNOSIS — E785 Hyperlipidemia, unspecified: Secondary | ICD-10-CM | POA: Diagnosis not present

## 2017-03-07 DIAGNOSIS — I119 Hypertensive heart disease without heart failure: Secondary | ICD-10-CM | POA: Diagnosis not present

## 2017-03-07 DIAGNOSIS — E119 Type 2 diabetes mellitus without complications: Secondary | ICD-10-CM | POA: Diagnosis not present

## 2017-03-07 DIAGNOSIS — I10 Essential (primary) hypertension: Secondary | ICD-10-CM | POA: Diagnosis not present

## 2017-03-14 DIAGNOSIS — G4739 Other sleep apnea: Secondary | ICD-10-CM | POA: Diagnosis not present

## 2017-03-14 DIAGNOSIS — R739 Hyperglycemia, unspecified: Secondary | ICD-10-CM | POA: Diagnosis not present

## 2017-03-14 DIAGNOSIS — I119 Hypertensive heart disease without heart failure: Secondary | ICD-10-CM | POA: Diagnosis not present

## 2017-04-01 DIAGNOSIS — I119 Hypertensive heart disease without heart failure: Secondary | ICD-10-CM | POA: Diagnosis not present

## 2017-05-03 DIAGNOSIS — E119 Type 2 diabetes mellitus without complications: Secondary | ICD-10-CM | POA: Diagnosis not present

## 2017-05-03 DIAGNOSIS — I10 Essential (primary) hypertension: Secondary | ICD-10-CM | POA: Diagnosis not present

## 2017-05-03 DIAGNOSIS — I119 Hypertensive heart disease without heart failure: Secondary | ICD-10-CM | POA: Diagnosis not present

## 2017-05-03 DIAGNOSIS — E785 Hyperlipidemia, unspecified: Secondary | ICD-10-CM | POA: Diagnosis not present

## 2017-06-07 DIAGNOSIS — I10 Essential (primary) hypertension: Secondary | ICD-10-CM | POA: Diagnosis not present

## 2017-06-07 DIAGNOSIS — I119 Hypertensive heart disease without heart failure: Secondary | ICD-10-CM | POA: Diagnosis not present

## 2017-06-07 DIAGNOSIS — E119 Type 2 diabetes mellitus without complications: Secondary | ICD-10-CM | POA: Diagnosis not present

## 2017-06-07 DIAGNOSIS — E785 Hyperlipidemia, unspecified: Secondary | ICD-10-CM | POA: Diagnosis not present

## 2017-07-12 DIAGNOSIS — I10 Essential (primary) hypertension: Secondary | ICD-10-CM | POA: Diagnosis not present

## 2017-07-12 DIAGNOSIS — E119 Type 2 diabetes mellitus without complications: Secondary | ICD-10-CM | POA: Diagnosis not present

## 2017-07-12 DIAGNOSIS — E785 Hyperlipidemia, unspecified: Secondary | ICD-10-CM | POA: Diagnosis not present

## 2017-07-12 DIAGNOSIS — M545 Low back pain: Secondary | ICD-10-CM | POA: Diagnosis not present

## 2017-08-05 DIAGNOSIS — G4733 Obstructive sleep apnea (adult) (pediatric): Secondary | ICD-10-CM | POA: Diagnosis not present

## 2017-08-05 DIAGNOSIS — I119 Hypertensive heart disease without heart failure: Secondary | ICD-10-CM | POA: Diagnosis not present

## 2017-08-05 DIAGNOSIS — I10 Essential (primary) hypertension: Secondary | ICD-10-CM | POA: Diagnosis not present

## 2017-08-05 DIAGNOSIS — E785 Hyperlipidemia, unspecified: Secondary | ICD-10-CM | POA: Diagnosis not present

## 2017-11-04 DIAGNOSIS — E785 Hyperlipidemia, unspecified: Secondary | ICD-10-CM | POA: Diagnosis not present

## 2017-11-04 DIAGNOSIS — I10 Essential (primary) hypertension: Secondary | ICD-10-CM | POA: Diagnosis not present

## 2017-11-04 DIAGNOSIS — G4733 Obstructive sleep apnea (adult) (pediatric): Secondary | ICD-10-CM | POA: Diagnosis not present

## 2017-11-04 DIAGNOSIS — Z0000 Encounter for general adult medical examination without abnormal findings: Secondary | ICD-10-CM | POA: Diagnosis not present

## 2017-11-04 DIAGNOSIS — E119 Type 2 diabetes mellitus without complications: Secondary | ICD-10-CM | POA: Diagnosis not present

## 2017-11-04 DIAGNOSIS — Z136 Encounter for screening for cardiovascular disorders: Secondary | ICD-10-CM | POA: Diagnosis not present

## 2017-11-04 DIAGNOSIS — Z125 Encounter for screening for malignant neoplasm of prostate: Secondary | ICD-10-CM | POA: Diagnosis not present

## 2017-11-04 DIAGNOSIS — I119 Hypertensive heart disease without heart failure: Secondary | ICD-10-CM | POA: Diagnosis not present

## 2017-11-04 DIAGNOSIS — Z01118 Encounter for examination of ears and hearing with other abnormal findings: Secondary | ICD-10-CM | POA: Diagnosis not present

## 2017-12-08 ENCOUNTER — Other Ambulatory Visit: Payer: Self-pay | Admitting: Family

## 2017-12-08 DIAGNOSIS — D573 Sickle-cell trait: Secondary | ICD-10-CM

## 2017-12-08 DIAGNOSIS — D72819 Decreased white blood cell count, unspecified: Secondary | ICD-10-CM

## 2017-12-09 ENCOUNTER — Encounter: Payer: Self-pay | Admitting: Family

## 2017-12-09 ENCOUNTER — Inpatient Hospital Stay: Payer: BLUE CROSS/BLUE SHIELD

## 2017-12-09 ENCOUNTER — Other Ambulatory Visit: Payer: Self-pay

## 2017-12-09 ENCOUNTER — Inpatient Hospital Stay: Payer: BLUE CROSS/BLUE SHIELD | Attending: Family | Admitting: Family

## 2017-12-09 VITALS — BP 139/80 | HR 48 | Temp 98.7°F | Resp 18 | Wt 166.0 lb

## 2017-12-09 DIAGNOSIS — G4730 Sleep apnea, unspecified: Secondary | ICD-10-CM | POA: Diagnosis not present

## 2017-12-09 DIAGNOSIS — Z79899 Other long term (current) drug therapy: Secondary | ICD-10-CM | POA: Diagnosis not present

## 2017-12-09 DIAGNOSIS — Z7982 Long term (current) use of aspirin: Secondary | ICD-10-CM | POA: Diagnosis not present

## 2017-12-09 DIAGNOSIS — D72819 Decreased white blood cell count, unspecified: Secondary | ICD-10-CM | POA: Insufficient documentation

## 2017-12-09 DIAGNOSIS — N529 Male erectile dysfunction, unspecified: Secondary | ICD-10-CM

## 2017-12-09 DIAGNOSIS — D573 Sickle-cell trait: Secondary | ICD-10-CM | POA: Diagnosis not present

## 2017-12-09 DIAGNOSIS — I119 Hypertensive heart disease without heart failure: Secondary | ICD-10-CM | POA: Insufficient documentation

## 2017-12-09 DIAGNOSIS — R0789 Other chest pain: Secondary | ICD-10-CM | POA: Insufficient documentation

## 2017-12-09 LAB — CBC WITH DIFFERENTIAL (CANCER CENTER ONLY)
BASOS ABS: 0 10*3/uL (ref 0.0–0.1)
Basophils Relative: 0 %
EOS ABS: 0.1 10*3/uL (ref 0.0–0.5)
Eosinophils Relative: 2 %
HEMATOCRIT: 40 % (ref 38.7–49.9)
Hemoglobin: 13.5 g/dL (ref 13.0–17.1)
Lymphocytes Relative: 44 %
Lymphs Abs: 1.2 10*3/uL (ref 0.9–3.3)
MCH: 28 pg (ref 28.0–33.4)
MCHC: 33.8 g/dL (ref 32.0–35.9)
MCV: 82.8 fL (ref 82.0–98.0)
Monocytes Absolute: 0.3 10*3/uL (ref 0.1–0.9)
Monocytes Relative: 12 %
NEUTROS ABS: 1.2 10*3/uL — AB (ref 1.5–6.5)
NEUTROS PCT: 42 %
Platelet Count: 161 10*3/uL (ref 145–400)
RBC: 4.83 MIL/uL (ref 4.20–5.70)
RDW: 12.4 % (ref 11.1–15.7)
WBC: 2.7 10*3/uL — AB (ref 4.0–10.0)

## 2017-12-09 LAB — COMPREHENSIVE METABOLIC PANEL
ALT: 49 U/L — AB (ref 0–44)
AST: 21 U/L (ref 15–41)
Albumin: 4 g/dL (ref 3.5–5.0)
Alkaline Phosphatase: 62 U/L (ref 38–126)
Anion gap: 8 (ref 5–15)
BUN: 25 mg/dL — AB (ref 6–20)
CO2: 28 mmol/L (ref 22–32)
Calcium: 9.6 mg/dL (ref 8.9–10.3)
Chloride: 108 mmol/L (ref 98–111)
Creatinine, Ser: 1.55 mg/dL — ABNORMAL HIGH (ref 0.61–1.24)
GFR calc Af Amer: 60 mL/min — ABNORMAL LOW (ref >60)
GFR, EST NON AFRICAN AMERICAN: 52 mL/min — AB (ref >60)
Glucose, Bld: 152 mg/dL — ABNORMAL HIGH (ref 70–99)
Potassium: 3.7 mmol/L (ref 3.5–5.1)
Sodium: 144 mmol/L (ref 135–145)
Total Bilirubin: 0.5 mg/dL (ref 0.3–1.2)
Total Protein: 8.4 g/dL — ABNORMAL HIGH (ref 6.5–8.1)

## 2017-12-09 LAB — RETICULOCYTES
RBC.: 4.79 MIL/uL (ref 4.22–5.81)
Retic Count, Absolute: 38.3 10*3/uL (ref 19.0–186.0)
Retic Ct Pct: 0.8 % (ref 0.4–3.1)

## 2017-12-09 LAB — SAVE SMEAR

## 2017-12-09 NOTE — Progress Notes (Signed)
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 is a pleasant 47 yo gentleman gentle man originally from [place] with history of ethnic associated leukopenia as well and the sickle cell trait. He is an old patient of ours and last saw Dr. Marin Olp in 2015. His PCP wanted him to re-establish care for occasional monitoring.  WBC count is stable at 2.7, no anemia and platelet count is 161.  So far, he continues to do well and has no complaints at this time.  He has had no issue with infections. No fever, chills, n/v, cough, rash, dizziness, SOB,  palpitations, abdominal pain or changes in bowel or bladder habits.  He states that he will have occasional episodes of chest discomfort last 5-10 minutes that resolve if he takes a break to rest.  No issue with bleeding, no bruising or petetchiae.  No lymphadenopathy noted on exam.  No swelling, tenderness, numbness or tingling in his extremities. No c/o pain.  He states that he is borderline diabetic. No currently on treatment.  He has a good appetite and is staying well hydrated. His weight is stable.  He works for [employer].   ROS: All other 10 point review of systems is negative.   PAST MEDICAL HISTORY:   Past Medical History:  Diagnosis Date  . ED (erectile dysfunction)   . HTN (hypertension)   . Hypopotassemia   . Impaired fasting glucose   . Leukocytopenia, unspecified   . Leukopenia 12/01/2012  . Sickle-cell trait ([place] [place]) 06/07/2013  . Sleep apnea   . Unspecified hypertensive heart disease without heart failure     ALLERGIES: No Known Allergies    MEDICATIONS:  Current Outpatient Medications on File Prior to Visit  Medication Sig Dispense Refill  .  AMLODIPINE BESYLATE PO Take 1 tablet by mouth daily.    Marland Kitchen aspirin 81 MG tablet Take 81 mg by mouth. Not taking as directed    . BYSTOLIC 10 MG tablet Take 10 mg by mouth.    Marland Kitchen eplerenone (INSPRA) 25 MG tablet Take 25 mg by mouth.  2  . hydrALAZINE (APRESOLINE) 50 MG tablet 50 mg 4 (four) times daily.     . Multiple Vitamins-Minerals (MEGA MULTIVITAMIN FOR MEN PO) Take by mouth every morning.    . olmesartan (BENICAR) 40 MG tablet Take 40 mg by mouth.  3   No current facility-administered medications on file prior to visit.      PAST SURGICAL HISTORY Past Surgical History:  Procedure Laterality Date  . NO PAST SURGERIES      FAMILY HISTORY: Family History  Problem Relation Age of Onset  . Hypertension Mother     SOCIAL HISTORY:  reports that he has never smoked. He has never used smokeless tobacco. He reports that he drinks alcohol. He reports that he does not use drugs.  PERFORMANCE STATUS: The patient's performance status is 0 - Asymptomatic  PHYSICAL EXAM: Most Recent Vital Signs: Blood pressure 139/80, pulse (!) 48, temperature 98.7 F (37.1 C), temperature source Oral, resp. rate 18, weight 166 lb (75.3 kg), SpO2 100 %. BP 139/80 (BP Location: Left Arm, Patient Position: Sitting)   Pulse (!) 48   Temp 98.7 F (37.1 C) (Oral)  Resp 18   Wt 166 lb (75.3 kg)   SpO2 100%   BMI 23.82 kg/m   General Appearance:    Alert, cooperative, no distress, appears stated age  Head:    Normocephalic, without obvious abnormality, atraumatic  Eyes:    PERRL, conjunctiva/corneas clear, EOM's intact, fundi    benign, both eyes  Ears:    Normal TM's and external ear canals, both ears  Nose:   Nares normal, septum midline, mucosa normal, no drainage    or sinus tenderness  Throat:   Lips, mucosa, and tongue normal; teeth and gums normal  Neck:   Supple, symmetrical, trachea midline, no adenopathy;    thyroid:  no enlargement/tenderness/nodules; no carotid   bruit or JVD  Back:      Symmetric, no curvature, ROM normal, no CVA tenderness  Lungs:     Clear to auscultation bilaterally, respirations unlabored  Chest Wall:    No tenderness or deformity   Heart:    Regular rate and rhythm, S1 and S2 normal, no murmur, rub   or gallop     Abdomen:     Soft, non-tender, bowel sounds active all four quadrants,    no masses, no organomegaly        Extremities:   Extremities normal, atraumatic, no cyanosis or edema  Pulses:   2+ and symmetric all extremities  Skin:   Skin color, texture, turgor normal, no rashes or lesions  Lymph nodes:   Cervical, supraclavicular, and axillary nodes normal  Neurologic:   CNII-XII intact, normal strength, sensation and reflexes    throughout    LABORATORY DATA:  Results for orders placed or performed in visit on 12/09/17 (from the past 48 hour(s))  CBC with Differential (Cancer Center Only)     Status: Abnormal   Collection Time: 12/09/17  3:19 PM  Result Value Ref Range   WBC Count 2.7 (L) 4.0 - 10.0 K/uL   RBC 4.83 4.20 - 5.70 MIL/uL   Hemoglobin 13.5 13.0 - 17.1 g/dL   HCT 40.0 38.7 - 49.9 %   MCV 82.8 82.0 - 98.0 fL   MCH 28.0 28.0 - 33.4 pg   MCHC 33.8 32.0 - 35.9 g/dL   RDW 12.4 11.1 - 15.7 %   Platelet Count 161 145 - 400 K/uL   Neutrophils Relative % 42 %   Neutro Abs 1.2 (L) 1.5 - 6.5 K/uL   Lymphocytes Relative 44 %   Lymphs Abs 1.2 0.9 - 3.3 K/uL   Monocytes Relative 12 %   Monocytes Absolute 0.3 0.1 - 0.9 K/uL   Eosinophils Relative 2 %   Eosinophils Absolute 0.1 0.0 - 0.5 K/uL   Basophils Relative 0 %   Basophils Absolute 0.0 0.0 - 0.1 K/uL    Comment: Performed at [facility] Lab at [facility], [address], [place], [place] [address]  Save smear     Status: None   Collection Time: 12/09/17  3:20 PM  Result Value Ref Range   Smear Review SMEAR STAINED AND AVAILABLE FOR REVIEW     Comment: Performed at [facility] Lab at [facility], [address] [place], [place] [address]      RADIOGRAPHY: No results found.     PATHOLOGY: None  ASSESSMENT/PLAN: [name] is a pleasant 47 yo gentleman from [place] with history of ethnic associated leukopenia and sickle cell trait. We had not seen him since 2015. He continues to  do well and has had no issue with anemia or infections. His counts remain stable.  We will continue to follow along with him and plan to see him back in another year.   All questions were answered and he is in agreement with the plan. He will contact our office with any questions or concerns. We can certainly see him sooner if need be.   He was discussed with and also seen by Dr. Marin Olp and he is in agreement with the aforementioned.   Laverna Peace     Addendum:  I saw and examined the patient with Judson Roch.  I agree with the above assessment.  I looked at his blood under the microscope.  I did not see any abnormalities with his white blood cells.  I saw no immature white blood cells that would suggest a myelodysplastic process or leukemia.  He had good maturity of his red cells and platelets.  His white blood cells have been low for at least 5 years.  I suspect that this is all ethnic associated leukopenia.  He has a slightly elevated lymphocyte count.  He is not anemic.  He has no thrombocytopenia.  I do not see any role for a bone marrow biopsy.  There is been totally asymptomatic with this.  He has not had no infections.  I do not think that we really need to do anything diagnostically.  We do not need any type of flow cytometry.  He apparently also has sickle cell trait.  I do not think this will be an issue for him.  I will plan to see him back in 1 year.  We spent 30 minutes with him.  All the time spent face-to-face.  We are coordinating care and counseling him about his leukopenia and what to watch out for.  Lattie Haw, MD

## 2017-12-11 MED ORDER — FOLIC ACID 1 MG PO TABS: 1.0000 mg | ORAL_TABLET | Freq: Every day | ORAL | 4 refills | Status: DC

## 2017-12-12 LAB — IRON AND TIBC
Iron: 75 ug/dL (ref 42–163)
Saturation Ratios: 25 % — ABNORMAL LOW (ref 42–163)
TIBC: 300 ug/dL (ref 202–409)
UIBC: 225 ug/dL

## 2017-12-12 LAB — FERRITIN: Ferritin: 240 ng/mL (ref 24–336)

## 2017-12-16 ENCOUNTER — Other Ambulatory Visit: Payer: Self-pay | Admitting: Family

## 2017-12-16 ENCOUNTER — Telehealth: Payer: Self-pay | Admitting: *Deleted

## 2017-12-16 DIAGNOSIS — D500 Iron deficiency anemia secondary to blood loss (chronic): Secondary | ICD-10-CM

## 2017-12-16 NOTE — Telephone Encounter (Addendum)
Patient is aware of instruction. He will come by the office to pick up cards  ----- Message from Verdie MosherSarah M Cincinnati, NP sent at 12/15/2017  9:22 AM EDT ----- Regarding: Stool card Can we get him to come back in for a stool card and have Shawanda explain how to collect the 3 samples? Iron is down a little and Dr. Myna HidalgoEnnever is concerned he is losing some blood through his stool. Thank you!!!  Sarah  ----- Message ----- From: Leory PlowmanInterface, Lab In [place] Sent: 12/09/2017   3:31 PM To: Verdie MosherSarah M [place], NP

## 2017-12-26 DIAGNOSIS — I10 Essential (primary) hypertension: Secondary | ICD-10-CM | POA: Diagnosis not present

## 2017-12-26 DIAGNOSIS — E785 Hyperlipidemia, unspecified: Secondary | ICD-10-CM | POA: Diagnosis not present

## 2017-12-26 DIAGNOSIS — I119 Hypertensive heart disease without heart failure: Secondary | ICD-10-CM | POA: Diagnosis not present

## 2017-12-26 DIAGNOSIS — E119 Type 2 diabetes mellitus without complications: Secondary | ICD-10-CM | POA: Diagnosis not present

## 2018-02-24 ENCOUNTER — Emergency Department (HOSPITAL_BASED_OUTPATIENT_CLINIC_OR_DEPARTMENT_OTHER): Payer: BLUE CROSS/BLUE SHIELD

## 2018-02-24 ENCOUNTER — Other Ambulatory Visit: Payer: Self-pay

## 2018-02-24 ENCOUNTER — Encounter (HOSPITAL_BASED_OUTPATIENT_CLINIC_OR_DEPARTMENT_OTHER): Payer: Self-pay | Admitting: Emergency Medicine

## 2018-02-24 ENCOUNTER — Emergency Department (HOSPITAL_BASED_OUTPATIENT_CLINIC_OR_DEPARTMENT_OTHER)
Admission: EM | Admit: 2018-02-24 | Discharge: 2018-02-24 | Disposition: A | Discharge status: Home / Self Care | Payer: BLUE CROSS/BLUE SHIELD | Attending: Emergency Medicine | Admitting: Emergency Medicine

## 2018-02-24 DIAGNOSIS — R0789 Other chest pain: Secondary | ICD-10-CM | POA: Insufficient documentation

## 2018-02-24 DIAGNOSIS — Y92410 Unspecified street and highway as the place of occurrence of the external cause: Secondary | ICD-10-CM | POA: Insufficient documentation

## 2018-02-24 DIAGNOSIS — Y998 Other external cause status: Secondary | ICD-10-CM | POA: Diagnosis not present

## 2018-02-24 DIAGNOSIS — M7918 Myalgia, other site: Secondary | ICD-10-CM | POA: Diagnosis not present

## 2018-02-24 DIAGNOSIS — S299XXA Unspecified injury of thorax, initial encounter: Secondary | ICD-10-CM | POA: Diagnosis not present

## 2018-02-24 DIAGNOSIS — R200 Anesthesia of skin: Secondary | ICD-10-CM | POA: Diagnosis not present

## 2018-02-24 DIAGNOSIS — M542 Cervicalgia: Secondary | ICD-10-CM | POA: Diagnosis not present

## 2018-02-24 DIAGNOSIS — Y9389 Activity, other specified: Secondary | ICD-10-CM | POA: Insufficient documentation

## 2018-02-24 DIAGNOSIS — Z7982 Long term (current) use of aspirin: Secondary | ICD-10-CM | POA: Insufficient documentation

## 2018-02-24 DIAGNOSIS — R1084 Generalized abdominal pain: Secondary | ICD-10-CM | POA: Diagnosis not present

## 2018-02-24 DIAGNOSIS — S3991XA Unspecified injury of abdomen, initial encounter: Secondary | ICD-10-CM | POA: Diagnosis not present

## 2018-02-24 DIAGNOSIS — S3993XA Unspecified injury of pelvis, initial encounter: Secondary | ICD-10-CM | POA: Diagnosis not present

## 2018-02-24 DIAGNOSIS — I10 Essential (primary) hypertension: Secondary | ICD-10-CM | POA: Diagnosis not present

## 2018-02-24 DIAGNOSIS — S0990XA Unspecified injury of head, initial encounter: Secondary | ICD-10-CM | POA: Diagnosis not present

## 2018-02-24 DIAGNOSIS — M549 Dorsalgia, unspecified: Secondary | ICD-10-CM | POA: Insufficient documentation

## 2018-02-24 DIAGNOSIS — Z79899 Other long term (current) drug therapy: Secondary | ICD-10-CM | POA: Insufficient documentation

## 2018-02-24 DIAGNOSIS — S161XXA Strain of muscle, fascia and tendon at neck level, initial encounter: Secondary | ICD-10-CM | POA: Insufficient documentation

## 2018-02-24 DIAGNOSIS — R52 Pain, unspecified: Secondary | ICD-10-CM | POA: Diagnosis not present

## 2018-02-24 DIAGNOSIS — S199XXA Unspecified injury of neck, initial encounter: Secondary | ICD-10-CM | POA: Diagnosis not present

## 2018-02-24 LAB — CBC WITH DIFFERENTIAL/PLATELET
Abs Immature Granulocytes: 0 10*3/uL (ref 0.00–0.07)
BASOS ABS: 0 10*3/uL (ref 0.0–0.1)
Basophils Relative: 0 %
EOS PCT: 1 %
Eosinophils Absolute: 0 10*3/uL (ref 0.0–0.5)
HEMATOCRIT: 42.3 % (ref 39.0–52.0)
HEMOGLOBIN: 13.7 g/dL (ref 13.0–17.0)
Immature Granulocytes: 0 %
LYMPHS ABS: 1.4 10*3/uL (ref 0.7–4.0)
Lymphocytes Relative: 49 %
MCH: 27.6 pg (ref 26.0–34.0)
MCHC: 32.4 g/dL (ref 30.0–36.0)
MCV: 85.1 fL (ref 80.0–100.0)
MONOS PCT: 14 %
Monocytes Absolute: 0.4 10*3/uL (ref 0.1–1.0)
NRBC: 0 % (ref 0.0–0.2)
Neutro Abs: 1.1 10*3/uL — ABNORMAL LOW (ref 1.7–7.7)
Neutrophils Relative %: 36 %
Platelets: 167 10*3/uL (ref 150–400)
RBC: 4.97 MIL/uL (ref 4.22–5.81)
RDW: 12.8 % (ref 11.5–15.5)
WBC: 3 10*3/uL — ABNORMAL LOW (ref 4.0–10.5)

## 2018-02-24 LAB — TROPONIN I: Troponin I: <0.03 ng/mL (ref <0.03)

## 2018-02-24 LAB — COMPREHENSIVE METABOLIC PANEL
ALBUMIN: 4.1 g/dL (ref 3.5–5.0)
ALK PHOS: 60 U/L (ref 38–126)
ALT: 31 U/L (ref 0–44)
AST: 24 U/L (ref 15–41)
Anion gap: 10 (ref 5–15)
BILIRUBIN TOTAL: 0.4 mg/dL (ref 0.3–1.2)
BUN: 19 mg/dL (ref 6–20)
CO2: 25 mmol/L (ref 22–32)
CREATININE: 1.15 mg/dL (ref 0.61–1.24)
Calcium: 9.6 mg/dL (ref 8.9–10.3)
Chloride: 106 mmol/L (ref 98–111)
GFR calc Af Amer: >60 mL/min (ref >60)
GLUCOSE: 118 mg/dL — AB (ref 70–99)
POTASSIUM: 3.5 mmol/L (ref 3.5–5.1)
Sodium: 141 mmol/L (ref 135–145)
TOTAL PROTEIN: 8.7 g/dL — AB (ref 6.5–8.1)

## 2018-02-24 LAB — ETHANOL: Alcohol, Ethyl (B): <10 mg/dL (ref <10)

## 2018-02-24 MED ORDER — IBUPROFEN 600 MG PO TABS: 600.0000 mg | ORAL_TABLET | Freq: Three times a day (TID) | ORAL | 0 refills | Status: DC | PRN

## 2018-02-24 MED ORDER — FENTANYL CITRATE (PF) 100 MCG/2ML IJ SOLN
50.0000 ug | Freq: Once | INTRAMUSCULAR | Status: AC
  Administered 2018-02-24: 50 ug via INTRAVENOUS
  Filled 2018-02-24: qty 2

## 2018-02-24 MED ORDER — METHOCARBAMOL 1000 MG/10ML IJ SOLN
1000.0000 mg | Freq: Once | INTRAMUSCULAR | Status: DC
  Filled 2018-02-24: qty 10

## 2018-02-24 MED ORDER — IOPAMIDOL (ISOVUE-300) INJECTION 61%
100.0000 mL | Freq: Once | INTRAVENOUS | Status: AC | PRN
  Administered 2018-02-24: 100 mL via INTRAVENOUS

## 2018-02-24 MED ORDER — METHOCARBAMOL 500 MG PO TABS: 1000.0000 mg | ORAL_TABLET | Freq: Two times a day (BID) | ORAL | 0 refills | Status: DC

## 2018-02-24 MED ORDER — METHOCARBAMOL 500 MG PO TABS
1000.0000 mg | ORAL_TABLET | Freq: Once | ORAL | Status: AC
  Administered 2018-02-24: 1000 mg via ORAL
  Filled 2018-02-24: qty 2

## 2018-02-24 MED ORDER — KETOROLAC TROMETHAMINE 15 MG/ML IJ SOLN
15.0000 mg | Freq: Once | INTRAMUSCULAR | Status: AC
  Administered 2018-02-24: 15 mg via INTRAVENOUS
  Filled 2018-02-24: qty 1

## 2018-02-24 NOTE — ED Notes (Signed)
Patient transported to CT 
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
## 2018-02-24 NOTE — ED Notes (Signed)
Pt was able to ambulate with minimal assistance. Pt reports "it's much better" than earlier. Pt continues to take slow, small steps but reports that it's easier.

## 2018-03-01 DIAGNOSIS — S139XXD Sprain of joints and ligaments of unspecified parts of neck, subsequent encounter: Secondary | ICD-10-CM | POA: Diagnosis not present

## 2018-03-01 DIAGNOSIS — M542 Cervicalgia: Secondary | ICD-10-CM | POA: Diagnosis not present

## 2018-03-06 ENCOUNTER — Encounter: Payer: Self-pay | Admitting: Physical Therapy

## 2018-03-06 ENCOUNTER — Ambulatory Visit (INDEPENDENT_AMBULATORY_CARE_PROVIDER_SITE_OTHER): Payer: BLUE CROSS/BLUE SHIELD | Admitting: Physical Therapy

## 2018-03-06 DIAGNOSIS — M542 Cervicalgia: Secondary | ICD-10-CM | POA: Diagnosis not present
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
## 2018-03-06 NOTE — Patient Instructions (Addendum)

## 2018-03-06 NOTE — Addendum Note (Signed)
Addended by: Ivery Quale R on: 03/06/2018 01:05 PM   Modules accepted: Orders

## 2018-03-09 ENCOUNTER — Encounter: Payer: Self-pay | Admitting: Physical Therapy

## 2018-03-09 ENCOUNTER — Ambulatory Visit (INDEPENDENT_AMBULATORY_CARE_PROVIDER_SITE_OTHER): Payer: BLUE CROSS/BLUE SHIELD | Admitting: Physical Therapy

## 2018-03-09 DIAGNOSIS — M542 Cervicalgia: Secondary | ICD-10-CM | POA: Diagnosis not present

## 2018-03-09 NOTE — Therapy (Signed)
[facility] [place] [address] [place], [place], [address] Phone: [phone]   Fax:  [phone]  Physical Therapy Treatment  Patient Details  Name: Daniel Barrera MRN: [id number] Date of Birth: 04/18/1971 Referring Provider (PT): Jackie Plum, MD   Encounter Date: 03/09/2018  PT End of Session - 03/09/18 1112    Visit Number  2    Number of Visits  12    Date for PT Re-Evaluation  04/17/18    Authorization Type  BCBS    PT Start Time  1030    PT Stop Time  1125    PT Time Calculation (min)  55 min    Activity Tolerance  Patient limited by pain    Behavior During Therapy  [facility] for tasks assessed/performed       Past Medical History:  Diagnosis Date  . ED (erectile dysfunction)   . HTN (hypertension)   . Hypopotassemia   . Impaired fasting glucose   . Leukocytopenia, unspecified   . Leukopenia 12/01/2012  . Sickle-cell trait (HCC) 06/07/2013  . Sleep apnea   . Unspecified hypertensive heart disease without heart failure     Past Surgical History:  Procedure Laterality Date  . NO PAST SURGERIES      There were no vitals filed for this visit.  Subjective Assessment - 03/09/18 1029    Subjective  feels "a little better."    Pertinent History  PMH: HTN, sleep apnea. DM, renal disease    Limitations  Reading;Lifting;Walking    How long can you sit comfortably?  30  min    How long can you stand comfortably?  5 min    Diagnostic tests  CT trauma films obtained with no evidence of acute fracture, intracranial injury, C-spine injury    Patient Stated Goals  get rid of the pain and move better    Currently in Pain?  Yes    Pain Score  6     Pain Location  Neck    Pain Orientation  Right;Left    Pain Descriptors / Indicators  Aching;Sharp    Pain Type  Acute pain    Pain Onset  1 to 4 weeks ago    Pain Frequency  Constant    Aggravating Factors   turning head, looking down, standing, hugging his kids    Pain  Relieving Factors  meds, lying down on back                       [facility] Adult PT Treatment/Exercise - 03/09/18 1031      Exercises   Exercises  Neck      Neck Exercises: Machines for Strengthening   UBE (Upper Arm Bike)  L1 x 4 min (2' fwd/ 2' bwd)      Neck Exercises: Seated   Cervical Rotation  Right;Left;10 reps   with towel; 5 seconds   Cervical Rotation Limitations  cues for technique; minimal rotation    Other Seated Exercise  extension with towel 10 x 5 sec    Other Seated Exercise  scapular retraction 10 x 5 sec      Neck Exercises: Supine   Neck Retraction  10 reps;5 secs      Modalities   Modalities  Electrical Stimulation;Moist Heat      Moist Heat Therapy   Number Minutes Moist Heat  15 Minutes    Moist Heat Location  Cervical      Electrical Stimulation  Electrical Stimulation Location  cervical/upper trap    Statistician Action  IFC (TENS)    Electrical Stimulation Parameters   to tolerance    Electrical Stimulation Goals  Pain;Tone      Manual Therapy   Manual Therapy  Soft tissue mobilization    Soft tissue mobilization  bil cervical paraspinals and upper traps; PROM including cervical rotation      Neck Exercises: Stretches   Upper Trapezius Stretch  Right;Left;3 reps;30 seconds                  PT Long Term Goals - 03/06/18 1258      PT LONG TERM GOAL #1   Title  Pt will increase neck ROM to [facility]. 6 weeks 04/17/18.     Status  New      PT LONG TERM GOAL #2   Title  Pt will be able to return to work with less than 2-3/10 pain. 6 weeks 04/17/18.     Status  New      PT LONG TERM GOAL #3   Title  Pt will be able to perform his usual ADL's including lifting and carrying groceries with less than 2-3/10 pain. 6 weeks 04/17/18.       PT LONG TERM GOAL #4   Title  Pt will incrase UE strength to at least 4+/5 MMT bilat to improve funciton. 6 weeks 04/17/18.     Status  New            Plan - 03/09/18 1112     Clinical Impression Statement  Pt needed min cues for proper technique with HEP, and educated on benefits of mobilization rather than continuing to be guarded with his posture.  Pt with approx 60 degrees AAROM for cervical rotation in supine, but when asked to actively rotate pt only performed about 20 degrees of rotation.  Will continue to benefit from PT to maximize function.    Rehab Potential  Fair    Clinical Impairments Affecting Rehab Potential  high irritability of symptoms    PT Frequency  2x / week    PT Duration  6 weeks    PT Treatment/Interventions  Cryotherapy;Electrical Stimulation;Iontophoresis 4mg /ml Dexamethasone;Moist Heat;Traction;Ultrasound;Therapeutic activities;Therapeutic exercise;Neuromuscular re-education;Manual techniques;Passive range of motion;Dry needling;Taping;Vasopneumatic Device;Spinal Manipulations;Joint Manipulations    PT Next Visit Plan  review HEP for gentle ROM, consider STM and modalties for pain and guarding, pain edu, may try DN and or traction in future if appropriate    Consulted and Agree with Plan of Care  Patient       Patient will benefit from skilled therapeutic intervention in order to improve the following deficits and impairments:  Decreased activity tolerance, Decreased endurance, Decreased range of motion, Decreased strength, Hypomobility, Impaired flexibility, Postural dysfunction, Pain, Increased fascial restricitons, Increased muscle spasms  Visit Diagnosis: Cervicalgia     Problem List Patient Active Problem List   Diagnosis Date Noted  . Sickle-cell trait (HCC) 06/07/2013  . Chronic insomnia 01/23/2013  . Leukopenia 12/01/2012  . OSA (obstructive sleep apnea) 08/14/2012  . DM 01/30/2007  . HYPERTENSION 01/30/2007  . RHINITIS, ALLERGIC NEC 01/30/2007  . RENAL DISEASE 01/30/2007      Clarita Crane, PT, DPT 03/09/18 11:14 AM    [facility] [address]  [address] [place], [place], [address] Phone: [phone]   Fax:  [phone]  Name: Maclean Foister MRN: [id number] Date of Birth: 12/10/1970

## 2018-03-10 DIAGNOSIS — M545 Low back pain: Secondary | ICD-10-CM | POA: Diagnosis not present

## 2018-03-10 DIAGNOSIS — M542 Cervicalgia: Secondary | ICD-10-CM | POA: Diagnosis not present

## 2018-03-13 ENCOUNTER — Ambulatory Visit (INDEPENDENT_AMBULATORY_CARE_PROVIDER_SITE_OTHER): Payer: BLUE CROSS/BLUE SHIELD | Admitting: Physical Therapy

## 2018-03-13 ENCOUNTER — Ambulatory Visit: Payer: BLUE CROSS/BLUE SHIELD | Admitting: Physical Therapy

## 2018-03-13 DIAGNOSIS — G4739 Other sleep apnea: Secondary | ICD-10-CM | POA: Diagnosis not present

## 2018-03-13 DIAGNOSIS — Z0189 Encounter for other specified special examinations: Secondary | ICD-10-CM | POA: Diagnosis not present

## 2018-03-13 DIAGNOSIS — M542 Cervicalgia: Secondary | ICD-10-CM | POA: Diagnosis not present

## 2018-03-13 DIAGNOSIS — I119 Hypertensive heart disease without heart failure: Secondary | ICD-10-CM | POA: Diagnosis not present

## 2018-03-13 DIAGNOSIS — R739 Hyperglycemia, unspecified: Secondary | ICD-10-CM | POA: Diagnosis not present
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
## 2018-03-15 ENCOUNTER — Ambulatory Visit (INDEPENDENT_AMBULATORY_CARE_PROVIDER_SITE_OTHER): Payer: BLUE CROSS/BLUE SHIELD | Admitting: Physical Therapy

## 2018-03-15 ENCOUNTER — Encounter: Payer: Self-pay | Admitting: Physical Therapy

## 2018-03-15 DIAGNOSIS — M545 Low back pain, unspecified: Secondary | ICD-10-CM

## 2018-03-15 DIAGNOSIS — M542 Cervicalgia: Secondary | ICD-10-CM

## 2018-03-15 NOTE — Patient Instructions (Signed)
Access Code: [email or web address]  URL: [email or web address]  Date: 03/15/2018  Prepared by: Moshe Cipro   Exercises  Seated Cervical Sidebending Stretch - 2-3 sets - 30 hold - 2x daily - 6x weekly  Cervical Extension AROM with Strap - 10 reps - 1-2 sets - 5 hold - 2x daily - 6x weekly  Seated Assisted Cervical Rotation with Towel - 10 reps - 1-2 sets - 5 hold - 2x daily - 6x weekly  Supine Chin Tuck - 10 reps - 1-3 sets - 2x daily - 6x weekly  Seated Scapular Retraction - 10 reps - 1-3 sets - 5 sec hold - 2x daily - 6x weekly  Supine Piriformis Stretch with Foot on Ground - 3 reps - 1 sets - 30 sec hold - 2x daily - 7x weekly  Supine Hamstring Stretch with Strap - 3 reps - 1 sets - 30 sec hold - 2x daily - 7x weekly  Hooklying Single Knee to Chest - 3 reps - 1 sets - 30 sec hold - 2x daily - 7x weekly  Supine Lower Trunk Rotation - 3 reps - 1 sets - 30 sec hold - 2x daily - 7x weekly
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
## 2018-03-20 ENCOUNTER — Ambulatory Visit (INDEPENDENT_AMBULATORY_CARE_PROVIDER_SITE_OTHER): Payer: BLUE CROSS/BLUE SHIELD | Admitting: Physical Therapy

## 2018-03-20 ENCOUNTER — Encounter: Payer: Self-pay | Admitting: Physical Therapy

## 2018-03-20 DIAGNOSIS — M545 Low back pain, unspecified: Secondary | ICD-10-CM

## 2018-03-20 DIAGNOSIS — M542 Cervicalgia: Secondary | ICD-10-CM | POA: Diagnosis not present
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
## 2018-03-20 NOTE — Patient Instructions (Signed)
Resistive Band Rowing   With resistive band anchored in door, grasp both ends. Keeping elbows bent, pull back, squeezing shoulder blades together. Hold _3-5___ seconds. Repeat _10-30___ times. Do __1__ sessions per day.   Strengthening: Resisted Extension   Hold tubing with both hands, arms forward. Pull arms back, elbow straight. Repeat _10-30___ times per set. Do ___1_ sets per session. Do _1___ sessions per day.

## 2018-03-22 ENCOUNTER — Ambulatory Visit (INDEPENDENT_AMBULATORY_CARE_PROVIDER_SITE_OTHER): Payer: BLUE CROSS/BLUE SHIELD | Admitting: Physical Therapy

## 2018-03-22 ENCOUNTER — Encounter: Payer: Self-pay | Admitting: Physical Therapy

## 2018-03-22 DIAGNOSIS — M542 Cervicalgia: Secondary | ICD-10-CM

## 2018-03-22 DIAGNOSIS — M545 Low back pain, unspecified: Secondary | ICD-10-CM
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
## 2018-03-24 DIAGNOSIS — M542 Cervicalgia: Secondary | ICD-10-CM | POA: Diagnosis not present

## 2018-03-24 DIAGNOSIS — R739 Hyperglycemia, unspecified: Secondary | ICD-10-CM | POA: Diagnosis not present

## 2018-03-24 DIAGNOSIS — M545 Low back pain: Secondary | ICD-10-CM | POA: Diagnosis not present

## 2018-03-24 DIAGNOSIS — Z23 Encounter for immunization: Secondary | ICD-10-CM | POA: Diagnosis not present

## 2018-03-30 ENCOUNTER — Ambulatory Visit (INDEPENDENT_AMBULATORY_CARE_PROVIDER_SITE_OTHER): Payer: BLUE CROSS/BLUE SHIELD | Admitting: Physical Therapy

## 2018-03-30 ENCOUNTER — Encounter: Payer: Self-pay | Admitting: Physical Therapy

## 2018-03-30 DIAGNOSIS — M542 Cervicalgia: Secondary | ICD-10-CM

## 2018-03-30 DIAGNOSIS — M545 Low back pain, unspecified: Secondary | ICD-10-CM

## 2018-03-30 DIAGNOSIS — E119 Type 2 diabetes mellitus without complications: Secondary | ICD-10-CM | POA: Diagnosis not present
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
## 2018-04-11 DIAGNOSIS — E119 Type 2 diabetes mellitus without complications: Secondary | ICD-10-CM | POA: Diagnosis not present

## 2018-04-11 DIAGNOSIS — M545 Low back pain: Secondary | ICD-10-CM | POA: Diagnosis not present

## 2018-04-22 DIAGNOSIS — E119 Type 2 diabetes mellitus without complications: Secondary | ICD-10-CM | POA: Diagnosis not present

## 2018-04-22 DIAGNOSIS — R0602 Shortness of breath: Secondary | ICD-10-CM | POA: Diagnosis not present

## 2018-04-22 DIAGNOSIS — R6883 Chills (without fever): Secondary | ICD-10-CM | POA: Diagnosis not present

## 2018-04-22 DIAGNOSIS — J018 Other acute sinusitis: Secondary | ICD-10-CM | POA: Diagnosis not present

## 2018-04-23 DIAGNOSIS — E119 Type 2 diabetes mellitus without complications: Secondary | ICD-10-CM | POA: Diagnosis not present

## 2018-04-23 DIAGNOSIS — J111 Influenza due to unidentified influenza virus with other respiratory manifestations: Secondary | ICD-10-CM | POA: Diagnosis not present

## 2018-04-25 DIAGNOSIS — R509 Fever, unspecified: Secondary | ICD-10-CM | POA: Diagnosis not present

## 2018-04-25 DIAGNOSIS — E785 Hyperlipidemia, unspecified: Secondary | ICD-10-CM | POA: Diagnosis not present

## 2018-04-25 DIAGNOSIS — I10 Essential (primary) hypertension: Secondary | ICD-10-CM | POA: Diagnosis not present

## 2018-04-25 DIAGNOSIS — I119 Hypertensive heart disease without heart failure: Secondary | ICD-10-CM | POA: Diagnosis not present

## 2018-04-25 DIAGNOSIS — E119 Type 2 diabetes mellitus without complications: Secondary | ICD-10-CM | POA: Diagnosis not present

## 2018-04-25 DIAGNOSIS — N179 Acute kidney failure, unspecified: Secondary | ICD-10-CM | POA: Diagnosis not present

## 2018-05-03 DIAGNOSIS — E785 Hyperlipidemia, unspecified: Secondary | ICD-10-CM | POA: Diagnosis not present

## 2018-05-03 DIAGNOSIS — I10 Essential (primary) hypertension: Secondary | ICD-10-CM | POA: Diagnosis not present

## 2018-05-03 DIAGNOSIS — I119 Hypertensive heart disease without heart failure: Secondary | ICD-10-CM | POA: Diagnosis not present

## 2018-05-03 DIAGNOSIS — E119 Type 2 diabetes mellitus without complications: Secondary | ICD-10-CM | POA: Diagnosis not present

## 2018-05-14 DIAGNOSIS — G4733 Obstructive sleep apnea (adult) (pediatric): Secondary | ICD-10-CM | POA: Diagnosis not present

## 2018-06-02 DIAGNOSIS — E119 Type 2 diabetes mellitus without complications: Secondary | ICD-10-CM | POA: Diagnosis not present

## 2018-06-02 DIAGNOSIS — N289 Disorder of kidney and ureter, unspecified: Secondary | ICD-10-CM | POA: Diagnosis not present

## 2018-06-02 DIAGNOSIS — E785 Hyperlipidemia, unspecified: Secondary | ICD-10-CM | POA: Diagnosis not present

## 2018-06-02 DIAGNOSIS — I10 Essential (primary) hypertension: Secondary | ICD-10-CM | POA: Diagnosis not present

## 2018-07-03 DIAGNOSIS — E559 Vitamin D deficiency, unspecified: Secondary | ICD-10-CM | POA: Diagnosis not present

## 2018-07-03 DIAGNOSIS — N390 Urinary tract infection, site not specified: Secondary | ICD-10-CM | POA: Diagnosis not present

## 2018-07-03 DIAGNOSIS — I10 Essential (primary) hypertension: Secondary | ICD-10-CM | POA: Diagnosis not present

## 2018-07-03 DIAGNOSIS — E119 Type 2 diabetes mellitus without complications: Secondary | ICD-10-CM | POA: Diagnosis not present

## 2018-07-13 DIAGNOSIS — N182 Chronic kidney disease, stage 2 (mild): Secondary | ICD-10-CM | POA: Diagnosis not present

## 2018-07-13 DIAGNOSIS — R809 Proteinuria, unspecified: Secondary | ICD-10-CM | POA: Diagnosis not present

## 2018-07-13 DIAGNOSIS — I10 Essential (primary) hypertension: Secondary | ICD-10-CM | POA: Diagnosis not present

## 2018-07-18 ENCOUNTER — Other Ambulatory Visit: Payer: Self-pay | Admitting: Cardiology

## 2018-07-18 DIAGNOSIS — D72819 Decreased white blood cell count, unspecified: Secondary | ICD-10-CM

## 2018-07-18 DIAGNOSIS — D573 Sickle-cell trait: Secondary | ICD-10-CM

## 2018-07-20 DIAGNOSIS — E049 Nontoxic goiter, unspecified: Secondary | ICD-10-CM | POA: Diagnosis not present

## 2018-07-20 DIAGNOSIS — N400 Enlarged prostate without lower urinary tract symptoms: Secondary | ICD-10-CM | POA: Diagnosis not present

## 2018-07-20 DIAGNOSIS — N182 Chronic kidney disease, stage 2 (mild): Secondary | ICD-10-CM | POA: Diagnosis not present

## 2018-07-20 DIAGNOSIS — N281 Cyst of kidney, acquired: Secondary | ICD-10-CM | POA: Diagnosis not present

## 2018-07-25 DIAGNOSIS — N182 Chronic kidney disease, stage 2 (mild): Secondary | ICD-10-CM | POA: Diagnosis not present

## 2018-07-25 DIAGNOSIS — I10 Essential (primary) hypertension: Secondary | ICD-10-CM | POA: Diagnosis not present

## 2018-07-25 DIAGNOSIS — R809 Proteinuria, unspecified: Secondary | ICD-10-CM | POA: Diagnosis not present

## 2018-08-01 DIAGNOSIS — E119 Type 2 diabetes mellitus without complications: Secondary | ICD-10-CM | POA: Diagnosis not present

## 2018-08-01 DIAGNOSIS — N390 Urinary tract infection, site not specified: Secondary | ICD-10-CM | POA: Diagnosis not present

## 2018-08-01 DIAGNOSIS — E559 Vitamin D deficiency, unspecified: Secondary | ICD-10-CM | POA: Diagnosis not present

## 2018-08-01 DIAGNOSIS — I10 Essential (primary) hypertension: Secondary | ICD-10-CM | POA: Diagnosis not present

## 2018-08-04 DIAGNOSIS — I700 Atherosclerosis of aorta: Secondary | ICD-10-CM | POA: Diagnosis not present

## 2018-08-04 DIAGNOSIS — N281 Cyst of kidney, acquired: Secondary | ICD-10-CM | POA: Diagnosis not present

## 2018-08-09 DIAGNOSIS — E559 Vitamin D deficiency, unspecified: Secondary | ICD-10-CM | POA: Diagnosis not present

## 2018-08-09 DIAGNOSIS — E119 Type 2 diabetes mellitus without complications: Secondary | ICD-10-CM | POA: Diagnosis not present

## 2018-08-09 DIAGNOSIS — I10 Essential (primary) hypertension: Secondary | ICD-10-CM | POA: Diagnosis not present

## 2018-08-09 DIAGNOSIS — N390 Urinary tract infection, site not specified: Secondary | ICD-10-CM | POA: Diagnosis not present

## 2018-09-26 ENCOUNTER — Other Ambulatory Visit: Payer: Self-pay

## 2018-10-25 ENCOUNTER — Other Ambulatory Visit: Payer: Self-pay

## 2018-10-25 DIAGNOSIS — I10 Essential (primary) hypertension: Secondary | ICD-10-CM

## 2018-10-25 MED ORDER — AMLODIPINE BESYLATE 10 MG PO TABS: 10.0000 mg | ORAL_TABLET | Freq: Every day | ORAL | 3 refills | Status: DC

## 2018-11-06 DIAGNOSIS — E1165 Type 2 diabetes mellitus with hyperglycemia: Secondary | ICD-10-CM | POA: Diagnosis not present

## 2018-11-06 DIAGNOSIS — Z1389 Encounter for screening for other disorder: Secondary | ICD-10-CM | POA: Diagnosis not present

## 2018-11-06 DIAGNOSIS — G4733 Obstructive sleep apnea (adult) (pediatric): Secondary | ICD-10-CM | POA: Diagnosis not present

## 2018-11-06 DIAGNOSIS — Z1329 Encounter for screening for other suspected endocrine disorder: Secondary | ICD-10-CM | POA: Diagnosis not present

## 2018-11-06 DIAGNOSIS — I10 Essential (primary) hypertension: Secondary | ICD-10-CM | POA: Diagnosis not present

## 2018-11-06 DIAGNOSIS — Z0001 Encounter for general adult medical examination with abnormal findings: Secondary | ICD-10-CM | POA: Diagnosis not present

## 2018-11-06 DIAGNOSIS — I119 Hypertensive heart disease without heart failure: Secondary | ICD-10-CM | POA: Diagnosis not present

## 2018-11-06 DIAGNOSIS — E785 Hyperlipidemia, unspecified: Secondary | ICD-10-CM | POA: Diagnosis not present

## 2018-12-07 ENCOUNTER — Other Ambulatory Visit: Payer: Self-pay | Admitting: Family

## 2018-12-07 DIAGNOSIS — D573 Sickle-cell trait: Secondary | ICD-10-CM

## 2018-12-07 DIAGNOSIS — N401 Enlarged prostate with lower urinary tract symptoms: Secondary | ICD-10-CM | POA: Diagnosis not present

## 2018-12-07 DIAGNOSIS — D500 Iron deficiency anemia secondary to blood loss (chronic): Secondary | ICD-10-CM

## 2018-12-07 DIAGNOSIS — R350 Frequency of micturition: Secondary | ICD-10-CM | POA: Diagnosis not present

## 2018-12-07 DIAGNOSIS — D72819 Decreased white blood cell count, unspecified: Secondary | ICD-10-CM

## 2018-12-07 DIAGNOSIS — R972 Elevated prostate specific antigen [PSA]: Secondary | ICD-10-CM | POA: Diagnosis not present

## 2018-12-08 ENCOUNTER — Encounter: Payer: Self-pay | Admitting: Family

## 2018-12-08 ENCOUNTER — Inpatient Hospital Stay: Payer: BC Managed Care – PPO | Attending: Family | Admitting: Family

## 2018-12-08 ENCOUNTER — Inpatient Hospital Stay: Payer: BC Managed Care – PPO

## 2018-12-08 ENCOUNTER — Other Ambulatory Visit: Payer: Self-pay

## 2018-12-08 VITALS — BP 145/90 | HR 45 | Temp 97.6°F | Resp 18 | Ht 70.0 in | Wt 164.0 lb

## 2018-12-08 DIAGNOSIS — D72819 Decreased white blood cell count, unspecified: Secondary | ICD-10-CM

## 2018-12-08 DIAGNOSIS — D573 Sickle-cell trait: Secondary | ICD-10-CM | POA: Diagnosis not present

## 2018-12-08 DIAGNOSIS — D500 Iron deficiency anemia secondary to blood loss (chronic): Secondary | ICD-10-CM

## 2018-12-08 DIAGNOSIS — D72818 Other decreased white blood cell count: Secondary | ICD-10-CM | POA: Insufficient documentation

## 2018-12-08 LAB — CMP (CANCER CENTER ONLY)
ALT: 27 U/L (ref 0–44)
AST: 19 U/L (ref 15–41)
Albumin: 4.2 g/dL (ref 3.5–5.0)
Alkaline Phosphatase: 60 U/L (ref 38–126)
Anion gap: 7 (ref 5–15)
BUN: 18 mg/dL (ref 6–20)
CO2: 28 mmol/L (ref 22–32)
Calcium: 8.6 mg/dL — ABNORMAL LOW (ref 8.9–10.3)
Chloride: 106 mmol/L (ref 98–111)
Creatinine: 1.21 mg/dL (ref 0.61–1.24)
GFR, Est AFR Am: >60 mL/min (ref >60)
GFR, Estimated: >60 mL/min (ref >60)
Glucose, Bld: 128 mg/dL — ABNORMAL HIGH (ref 70–99)
Potassium: 3.6 mmol/L (ref 3.5–5.1)
Sodium: 141 mmol/L (ref 135–145)
Total Bilirubin: 0.4 mg/dL (ref 0.3–1.2)
Total Protein: 7.6 g/dL (ref 6.5–8.1)

## 2018-12-08 LAB — CBC WITH DIFFERENTIAL (CANCER CENTER ONLY)
Abs Immature Granulocytes: 0.01 10*3/uL (ref 0.00–0.07)
Basophils Absolute: 0 10*3/uL (ref 0.0–0.1)
Basophils Relative: 0 %
Eosinophils Absolute: 0 10*3/uL (ref 0.0–0.5)
Eosinophils Relative: 2 %
HCT: 42.1 % (ref 39.0–52.0)
Hemoglobin: 13.4 g/dL (ref 13.0–17.0)
Immature Granulocytes: 0 %
Lymphocytes Relative: 44 %
Lymphs Abs: 1 10*3/uL (ref 0.7–4.0)
MCH: 26.9 pg (ref 26.0–34.0)
MCHC: 31.8 g/dL (ref 30.0–36.0)
MCV: 84.4 fL (ref 80.0–100.0)
Monocytes Absolute: 0.3 10*3/uL (ref 0.1–1.0)
Monocytes Relative: 11 %
Neutro Abs: 1 10*3/uL — ABNORMAL LOW (ref 1.7–7.7)
Neutrophils Relative %: 43 %
Platelet Count: 156 10*3/uL (ref 150–400)
RBC: 4.99 MIL/uL (ref 4.22–5.81)
RDW: 13 % (ref 11.5–15.5)
WBC Count: 2.3 10*3/uL — ABNORMAL LOW (ref 4.0–10.5)
nRBC: 0 % (ref 0.0–0.2)

## 2018-12-08 LAB — RETICULOCYTES
Immature Retic Fract: 16.3 % — ABNORMAL HIGH (ref 2.3–15.9)
RBC.: 4.87 MIL/uL (ref 4.22–5.81)
Retic Count, Absolute: 53.1 10*3/uL (ref 19.0–186.0)
Retic Ct Pct: 1.1 % (ref 0.4–3.1)

## 2018-12-08 LAB — SAVE SMEAR(SSMR), FOR PROVIDER SLIDE REVIEW

## 2018-12-08 MED ORDER — FOLIC ACID 1 MG PO TABS: 1.0000 mg | ORAL_TABLET | Freq: Every day | ORAL | 4 refills | Status: AC

## 2018-12-08 NOTE — Progress Notes (Signed)
Hematology and Oncology Follow Up Visit  Daniel Barrera [id number] 1971/01/03 48 y.o. 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 He states that he is doing well but is currently on an antibiotic for an ear infection. This is improving.  He denies fever, chills, n/v, cough, rash, dizziness, SOB, chest pain, palpitations, abdominal pain or changes in bowel or bladder habits.  He states that he has some constipation and if he strains he may have a little blood on his toilet tissue. He has not had any other noted blood loss. No bruising or petechiae.  He states that with recent visit with his PCP he had an enlarged prostate and will be starting Flomax. No swelling, tenderness, numbness or tingling in his extremities.  He has maintained a good appetite and is staying well hydrated. His weight is stable.  He is staying busy working from home and spending time with his family.   ECOG Performance Status: 0 - Asymptomatic  Medications:  Allergies as of 12/08/2018   No Known Allergies     Medication List       Accurate as of December 08, 2018 11:23 AM. If you have any questions, ask your nurse or doctor.        amLODipine 10 MG tablet Commonly known as: NORVASC Take 1 tablet (10 mg total) by mouth daily.   aspirin 81 MG tablet Take 81 mg by mouth. Not taking as directed   Bystolic 10 MG tablet Generic drug: nebivolol Take 10 mg by mouth.   eplerenone 25 MG tablet Commonly known as: INSPRA TAKE 1 TABLET BY MOUTH EVERY MORNING   folic acid 1 MG tablet Commonly known as: FOLVITE Take 1 tablet (1 mg total) by mouth daily.   hydrALAZINE 50 MG tablet Commonly known as: APRESOLINE 50 mg 4 (four) times daily.   ibuprofen 600 MG tablet Commonly known as: ADVIL Take 1 tablet (600 mg total) by mouth every 8  (eight) hours as needed.   MEGA MULTIVITAMIN FOR MEN PO Take by mouth every morning.   methocarbamol 500 MG tablet Commonly known as: ROBAXIN Take 2 tablets (1,000 mg total) by mouth 2 (two) times daily.   olmesartan 40 MG tablet Commonly known as: BENICAR Take 40 mg by mouth.       Allergies: No Known Allergies  Past Medical History, Surgical history, Social history, and Family History were reviewed and updated.  Review of Systems: All other 10 point review of systems is negative.   Physical Exam:  vitals were not taken for this visit.   Wt Readings from Last 3 Encounters:  12/09/17 166 lb (75.3 kg)  12/18/13 169 lb (76.7 kg)  11/09/13 170 lb (77.1 kg)    Ocular: Sclerae unicteric, pupils equal, round and reactive to light Ear-nose-throat: Oropharynx clear, dentition fair Lymphatic: No cervical or supraclavicular adenopathy Lungs no rales or rhonchi, good excursion bilaterally Heart regular rate and rhythm, no murmur appreciated Abd soft, nontender, positive bowel sounds, no liver or spleen tip palpated on exam, no fluid wave  MSK no focal spinal tenderness, no joint edema Neuro: non-focal, well-oriented, appropriate affect Breasts: Deferred   Lab Results  Component Value Date   WBC 2.3 (L) 12/08/2018   HGB 13.4 12/08/2018   HCT 42.1 12/08/2018   MCV 84.4 12/08/2018   PLT  156 12/08/2018   Lab Results  Component Value Date   FERRITIN 240 12/09/2017   IRON 75 12/09/2017   TIBC 300 12/09/2017   UIBC 225 12/09/2017   IRONPCTSAT 25 (L) 12/09/2017   Lab Results  Component Value Date   RETICCTPCT 1.1 12/08/2018   RBC 4.87 12/08/2018   RBC 4.99 12/08/2018   RETICCTABS 40.8 12/18/2013   No results found for: KPAFRELGTCHN, LAMBDASER, KAPLAMBRATIO No results found for: IGGSERUM, IGA, IGMSERUM No results found for: Odetta Pink, SPEI   Chemistry      Component Value Date/Time   NA 141 12/08/2018 1043    K 3.6 12/08/2018 1043   CL 106 12/08/2018 1043   CO2 28 12/08/2018 1043   BUN 18 12/08/2018 1043   CREATININE 1.21 12/08/2018 1043      Component Value Date/Time   CALCIUM 8.6 (L) 12/08/2018 1043   ALKPHOS 60 12/08/2018 1043   AST 19 12/08/2018 1043   ALT 27 12/08/2018 1043   BILITOT 0.4 12/08/2018 1043       Impression and Plan: Daniel Barrera is a pleasant 48 yo gentleman from [place] with history of ethnic associated leukopenia and sickle cell trait.  He continues to do well and has had no issue with anemia or infections. His counts remain stable.  He will continue the daily folic acid.  Iron studies are pending. We will bring him back next week for infusion if needed.  We will plan to see him back in another year.  He will contact our office with any questions or concerns. We can certainly see him sooner if needed.   Laverna Peace, NP 7/24/202011:23 AM

## 2018-12-11 ENCOUNTER — Telehealth: Payer: Self-pay | Admitting: Hematology & Oncology

## 2018-12-11 LAB — FERRITIN: Ferritin: 168 ng/mL (ref 24–336)

## 2018-12-11 LAB — IRON AND TIBC
Iron: 78 ug/dL (ref 42–163)
Saturation Ratios: 25 % (ref 20–55)
TIBC: 308 ug/dL (ref 202–409)
UIBC: 230 ug/dL (ref 117–376)

## 2018-12-11 NOTE — Telephone Encounter (Signed)
Called [facility] for patient letter/calendar mailed also per 7/24 los

## 2018-12-18 DIAGNOSIS — R972 Elevated prostate specific antigen [PSA]: Secondary | ICD-10-CM | POA: Diagnosis not present

## 2018-12-18 DIAGNOSIS — E1165 Type 2 diabetes mellitus with hyperglycemia: Secondary | ICD-10-CM | POA: Diagnosis not present

## 2018-12-18 DIAGNOSIS — I119 Hypertensive heart disease without heart failure: Secondary | ICD-10-CM | POA: Diagnosis not present

## 2018-12-18 DIAGNOSIS — G4733 Obstructive sleep apnea (adult) (pediatric): Secondary | ICD-10-CM | POA: Diagnosis not present

## 2018-12-18 DIAGNOSIS — N400 Enlarged prostate without lower urinary tract symptoms: Secondary | ICD-10-CM | POA: Diagnosis not present

## 2018-12-18 DIAGNOSIS — I10 Essential (primary) hypertension: Secondary | ICD-10-CM | POA: Diagnosis not present
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
## 2019-01-14 ENCOUNTER — Other Ambulatory Visit: Payer: Self-pay | Admitting: Cardiology

## 2019-01-14 DIAGNOSIS — D72819 Decreased white blood cell count, unspecified: Secondary | ICD-10-CM

## 2019-01-14 DIAGNOSIS — D573 Sickle-cell trait: Secondary | ICD-10-CM

## 2019-01-25 DIAGNOSIS — R350 Frequency of micturition: Secondary | ICD-10-CM | POA: Diagnosis not present

## 2019-01-25 DIAGNOSIS — N401 Enlarged prostate with lower urinary tract symptoms: Secondary | ICD-10-CM | POA: Diagnosis not present

## 2019-02-06 DIAGNOSIS — I10 Essential (primary) hypertension: Secondary | ICD-10-CM | POA: Diagnosis not present

## 2019-02-06 DIAGNOSIS — E559 Vitamin D deficiency, unspecified: Secondary | ICD-10-CM | POA: Diagnosis not present

## 2019-02-06 DIAGNOSIS — E119 Type 2 diabetes mellitus without complications: Secondary | ICD-10-CM | POA: Diagnosis not present

## 2019-02-06 DIAGNOSIS — N182 Chronic kidney disease, stage 2 (mild): Secondary | ICD-10-CM | POA: Diagnosis not present

## 2019-02-08 DIAGNOSIS — E119 Type 2 diabetes mellitus without complications: Secondary | ICD-10-CM | POA: Diagnosis not present

## 2019-02-08 DIAGNOSIS — I10 Essential (primary) hypertension: Secondary | ICD-10-CM | POA: Diagnosis not present

## 2019-02-08 DIAGNOSIS — E559 Vitamin D deficiency, unspecified: Secondary | ICD-10-CM | POA: Diagnosis not present

## 2019-02-08 DIAGNOSIS — N390 Urinary tract infection, site not specified: Secondary | ICD-10-CM | POA: Diagnosis not present

## 2019-03-05 DIAGNOSIS — E1165 Type 2 diabetes mellitus with hyperglycemia: Secondary | ICD-10-CM | POA: Diagnosis not present

## 2019-03-05 DIAGNOSIS — N400 Enlarged prostate without lower urinary tract symptoms: Secondary | ICD-10-CM | POA: Diagnosis not present

## 2019-03-05 DIAGNOSIS — I119 Hypertensive heart disease without heart failure: Secondary | ICD-10-CM | POA: Diagnosis not present

## 2019-03-05 DIAGNOSIS — E785 Hyperlipidemia, unspecified: Secondary | ICD-10-CM | POA: Diagnosis not present

## 2019-03-09 ENCOUNTER — Ambulatory Visit: Payer: Self-pay | Admitting: Cardiology

## 2019-04-03 DIAGNOSIS — N400 Enlarged prostate without lower urinary tract symptoms: Secondary | ICD-10-CM | POA: Diagnosis not present

## 2019-04-03 DIAGNOSIS — E785 Hyperlipidemia, unspecified: Secondary | ICD-10-CM | POA: Diagnosis not present

## 2019-04-03 DIAGNOSIS — E1165 Type 2 diabetes mellitus with hyperglycemia: Secondary | ICD-10-CM | POA: Diagnosis not present

## 2019-04-03 DIAGNOSIS — I119 Hypertensive heart disease without heart failure: Secondary | ICD-10-CM | POA: Diagnosis not present

## 2019-04-27 ENCOUNTER — Ambulatory Visit (INDEPENDENT_AMBULATORY_CARE_PROVIDER_SITE_OTHER): Payer: BC Managed Care – PPO | Admitting: Cardiology

## 2019-04-27 ENCOUNTER — Encounter: Payer: Self-pay | Admitting: Cardiology

## 2019-04-27 ENCOUNTER — Other Ambulatory Visit: Payer: Self-pay

## 2019-04-27 VITALS — BP 123/79 | HR 52 | Temp 98.2°F | Ht 70.0 in | Wt 162.0 lb

## 2019-04-27 DIAGNOSIS — R350 Frequency of micturition: Secondary | ICD-10-CM | POA: Diagnosis not present

## 2019-04-27 DIAGNOSIS — R9431 Abnormal electrocardiogram [ECG] [EKG]: Secondary | ICD-10-CM

## 2019-04-27 DIAGNOSIS — I10 Essential (primary) hypertension: Secondary | ICD-10-CM

## 2019-04-27 DIAGNOSIS — G4739 Other sleep apnea: Secondary | ICD-10-CM

## 2019-04-27 DIAGNOSIS — N401 Enlarged prostate with lower urinary tract symptoms: Secondary | ICD-10-CM | POA: Diagnosis not present

## 2019-04-27 MED ORDER — EPLERENONE 25 MG PO TABS: 25.0000 mg | ORAL_TABLET | Freq: Every morning | ORAL | 3 refills | Status: AC

## 2019-04-27 MED ORDER — BYSTOLIC 10 MG PO TABS: 10.0000 mg | ORAL_TABLET | Freq: Every day | ORAL | 3 refills | Status: AC

## 2019-04-27 NOTE — Progress Notes (Signed)
Primary Physician/Referring:  Jackie Plumsei-Bonsu, George, MD  Patient ID: Daniel Barrera, male    DOB: 04-02-1971, 48 y.o.   MRN: [id number]  Chief Complaint  Patient presents with  . Hypertension  . Follow-up    1 yr   HPI:    Daniel Barrera  is a 48 y.o.  AAM with hypertension, hyperglycemia, sleep apnea and on CPAP, mild renal insufficiency (stage II) and abnormal EKG. The last clinic visit was 12 month(s) ago. Symptoms do not include headache, confusion, visual disturbance, dizziness or shortness of breath.   He is presently using CPAP and [facility] sleep disordered [facility]. By report there is good compliance with treatment. He now is tolerating all the medications well. No specific complaints today. States that he is feeling well overall, in fact the best in a while.  Past Medical History:  Diagnosis Date  . ED (erectile dysfunction)   . HTN (hypertension)   . Hypopotassemia   . Impaired fasting glucose   . Leukocytopenia, unspecified   . Leukopenia 12/01/2012  . Sickle-cell trait (HCC) 06/07/2013  . Sleep apnea   . Unspecified hypertensive heart disease without heart failure    Past Surgical History:  Procedure Laterality Date  . NO PAST SURGERIES     Social History   Socioeconomic History  . Marital status: Married    Spouse name: Not on file  . Number of children: 2  . Years of education: Not on file  . Highest education level: Not on file  Occupational History  . Occupation: Airline pilotAccountant  Tobacco Use  . Smoking status: Never Smoker  . Smokeless tobacco: Never Used  . Tobacco comment: never used  Substance and Sexual Activity  . Alcohol use: Yes    Comment: ocassional  . Drug use: No  . Sexual activity: Not on file  Other Topics Concern  . Not on file  Social History Narrative  . Not on file   Social Determinants of Health   Financial Resource Strain:   . Difficulty of Paying Living Expenses: Not on file  Food Insecurity:   .  Worried About Programme researcher, broadcasting/film/videounning Out of Food in the Last Year: Not on file  . Ran Out of Food in the Last Year: Not on file  Transportation Needs:   . Lack of Transportation (Medical): Not on file  . Lack of Transportation (Non-Medical): Not on file  Physical Activity:   . Days of Exercise per Week: Not on file  . Minutes of Exercise per Session: Not on file  Stress:   . Feeling of Stress : Not on file  Social Connections:   . Frequency of Communication with Friends and Family: Not on file  . Frequency of Social Gatherings with Friends and Family: Not on file  . Attends Religious Services: Not on file  . Active Member of Clubs or Organizations: Not on file  . Attends BankerClub or Organization Meetings: Not on file  . Marital Status: Not on file  Intimate Partner Violence:   . Fear of Current or Ex-Partner: Not on file  . Emotionally Abused: Not on file  . Physically Abused: Not on file  . Sexually Abused: Not on file   ROS  Review of Systems  Constitution: Negative for chills, decreased appetite, malaise/fatigue and weight gain.  Cardiovascular: Negative for dyspnea on exertion, leg swelling and syncope.  Respiratory: Positive for snoring (on CPAP).   Endocrine: Negative for cold intolerance.  Hematologic/Lymphatic: Does not bruise/bleed easily.  Musculoskeletal: Negative for joint swelling.  Gastrointestinal: Negative for abdominal pain, anorexia, change in bowel habit, hematochezia and melena.  Genitourinary: Positive for frequency (chronic).  Neurological: Negative for headaches and light-headedness.  Psychiatric/Behavioral: Negative for depression and substance abuse.  All other systems reviewed and are negative.  Objective  Blood pressure 123/79, pulse (!) 52, temperature 98.2 F (36.8 C), height 5\' 10"  (1.778 m), weight 162 lb (73.5 kg), SpO2 99 %.  Vitals with BMI 04/27/2019 12/08/2018 02/24/2018  Height 5\' 10"  5\' 10"  -  Weight 162 lbs 164 lbs -  BMI 23.24 23.53 -  Systolic 123 145  156  Diastolic 79 90 95  Pulse 52 45 52      Physical Exam  Constitutional: He appears well-developed and well-nourished.  HENT:  Head: Atraumatic.  Eyes: Conjunctivae are normal.  Neck: No JVD present. No thyromegaly present.  Cardiovascular: Normal rate, regular rhythm, normal heart sounds and intact distal pulses. Exam reveals no gallop.  No murmur heard. No leg edema, no JVD.  Pulmonary/Chest: Effort normal and breath sounds normal.  Abdominal: Soft. Bowel sounds are normal.  Musculoskeletal:        General: Normal range of motion.     Cervical back: Neck supple.  Neurological: He is alert.  Skin: Skin is warm and dry.  Psychiatric: He has a normal mood and affect.   Laboratory examination:   Recent Labs    12/08/18 1043  NA 141  K 3.6  CL 106  CO2 28  GLUCOSE 128*  BUN 18  CREATININE 1.21  CALCIUM 8.6*  GFRNONAA >60  GFRAA >60   CrCl cannot be calculated (Patient's most recent lab result is older than the maximum 21 days allowed.).  CMP Latest Ref Rng & Units 12/08/2018 02/24/2018 12/09/2017  Glucose 70 - 99 mg/dL 12/10/2018) 04/26/2018) 12/11/2017)  BUN 6 - 20 mg/dL 18 19 902(I)  Creatinine 0.61 - 1.24 mg/dL 097(D 532(D 92(E)  Sodium 135 - 145 mmol/L 141 141 144  Potassium 3.5 - 5.1 mmol/L 3.6 3.5 3.7  Chloride 98 - 111 mmol/L 106 106 108  CO2 22 - 32 mmol/L 28 25 28   Calcium 8.9 - 10.3 mg/dL 2.68) 9.6 9.6  Total Protein 6.5 - 8.1 g/dL 7.6 3.41) 9.62(I)  Total Bilirubin 0.3 - 1.2 mg/dL 0.4 0.4 0.5  Alkaline Phos 38 - 126 U/L 60 60 62  AST 15 - 41 U/L 19 24 21   ALT 0 - 44 U/L 27 31 49(H)   CBC Latest Ref Rng & Units 12/08/2018 02/24/2018 12/09/2017  WBC 4.0 - 10.5 K/uL 2.3(L) 3.0(L) 2.7(L)  Hemoglobin 13.0 - 17.0 g/dL 2.1(J 12/10/2018  Hematocrit 39.0 - 52.0 % 42.1 42.3 40.0  Platelets 150 - 400 K/uL 156 167 161   Lipid Panel  No results found for: CHOL, TRIG, HDL, CHOLHDL, VLDL, LDLCALC, LDLDIRECT HEMOGLOBIN A1C No results found for: HGBA1C, MPG TSH No results for  input(s): TSH in the last 8760 hours. Medications and allergies  No Known Allergies   Current Outpatient Medications  Medication Instructions  . amLODipine-olmesartan (AZOR) 10-40 MG tablet 1 tablet, Oral, Daily  . aspirin 81 mg  . Bystolic 10 mg, Oral, Daily  . eplerenone (INSPRA) 25 mg, Oral,  Every morning - 10a  . folic acid (FOLVITE) 1 mg, Oral, Daily  . Multiple Vitamins-Minerals (MEGA MULTIVITAMIN FOR MEN PO)  Every morning - 10a  . sodium bicarbonate 650 mg, Oral, Daily  . tamsulosin (FLOMAX) 0.4 mg, Oral, Daily  Radiology:  No results found. Cardiac Studies:   Exercise sestamibi 04/30/11: EKG LVH, TWI inferior leads. Stress: Pseudonormalization, 12.5 METs. NL  Echo 10/26/10: Normal LVEF, Moderate LVH, grade I diastolic dysfunction.  Sleep Study  [03/2012]: positive for sleep apnea (uses cpap every night) and follows [facility].   Assessment     ICD-10-CM   1. Primary hypertension  I10 EKG 12-Lead    eplerenone (INSPRA) 25 MG tablet    BYSTOLIC 10 MG tablet  2. Mixed sleep apnea  G47.39   3. Nonspecific abnormal electrocardiogram (ECG) (EKG)  R94.31   4. Sickle-cell trait ([place])  D57.3   5. Leukopenia, unspecified type  D72.819     EKG:/03/2019: Marked sinus bradycardia at rate of 45 bpm, normal axis, poor R-wave progression, cannot exclude anteroseptal infarct old.  Nonspecific T abnormality. No significant change from  EKG 03/13/2018: Marked sinus bradycardia at the rate of 45 bpm.  Recommendations:   Meds ordered this encounter  Medications  . eplerenone (INSPRA) 25 MG tablet    Sig: Take 1 tablet (25 mg total) by mouth every morning.    Dispense:  90 tablet    Refill:  3  . BYSTOLIC 10 MG tablet    Sig: Take 1 tablet (10 mg total) by mouth daily.    Dispense:  90 tablet    Refill:  3    Daniel Barrera  is a 48 y.o. AA male with hypertension, hyperglycemia, sleep apnea and on CPAP, mild renal insufficiency (stage II) and abnormal EKG.  He  is presently using CPAP and follows up at [facility] for complex sleep disordered breathing. He states that He is feeling the best, states that his labs are within normal limits test 3 weeks ago with his PCP. No changes in the EKG.  No changes in the medications were done..  Blood pressure is well controlled.  He remains asymptomatic although he has marked sinus bradycardia.  I will see him back on a p.r.n. basis.  I refilled his medications, we'll request Dr. Iona Beard Osei-Bonsu to take over his care including prescription refills.  Adrian Prows, MD, [facility] 04/27/2019, 3:40 PM  [facility] [place] Pager: [phone] Office: [phone] If no answer Cell [phone]

## 2019-05-08 DIAGNOSIS — G4733 Obstructive sleep apnea (adult) (pediatric): Secondary | ICD-10-CM | POA: Diagnosis not present

## 2019-05-24 DIAGNOSIS — I10 Essential (primary) hypertension: Secondary | ICD-10-CM | POA: Diagnosis not present

## 2019-05-24 DIAGNOSIS — E559 Vitamin D deficiency, unspecified: Secondary | ICD-10-CM | POA: Diagnosis not present

## 2019-05-24 DIAGNOSIS — E119 Type 2 diabetes mellitus without complications: Secondary | ICD-10-CM | POA: Diagnosis not present

## 2019-05-24 DIAGNOSIS — N182 Chronic kidney disease, stage 2 (mild): Secondary | ICD-10-CM | POA: Diagnosis not present

## 2019-05-28 DIAGNOSIS — E559 Vitamin D deficiency, unspecified: Secondary | ICD-10-CM | POA: Diagnosis not present

## 2019-05-28 DIAGNOSIS — N390 Urinary tract infection, site not specified: Secondary | ICD-10-CM | POA: Diagnosis not present

## 2019-05-28 DIAGNOSIS — I10 Essential (primary) hypertension: Secondary | ICD-10-CM | POA: Diagnosis not present

## 2019-05-28 DIAGNOSIS — E119 Type 2 diabetes mellitus without complications: Secondary | ICD-10-CM | POA: Diagnosis not present

## 2019-06-11 DIAGNOSIS — N411 Chronic prostatitis: Secondary | ICD-10-CM | POA: Diagnosis not present

## 2019-06-11 DIAGNOSIS — R972 Elevated prostate specific antigen [PSA]: Secondary | ICD-10-CM | POA: Diagnosis not present

## 2019-07-19 ENCOUNTER — Ambulatory Visit: Payer: BC Managed Care – PPO | Attending: Family

## 2019-07-19 DIAGNOSIS — Z23 Encounter for immunization: Secondary | ICD-10-CM | POA: Insufficient documentation

## 2019-07-19 NOTE — Progress Notes (Signed)
   Covid-19 Vaccination Clinic  Name:  Daniel Barrera    MRN: [id number] DOB: 1970/11/30  07/19/2019  Daniel Barrera was observed post Covid-19 immunization for 15 minutes without incident. He was provided with Vaccine Information Sheet and instruction to access the V-Safe system.   Daniel Barrera was instructed to call 911 with any severe reactions post vaccine: Marland Kitchen Difficulty breathing  . Swelling of face and throat  . A fast heartbeat  . A bad rash all over body  . Dizziness and weakness   Immunizations Administered    Name Date Dose VIS Date Route   Moderna COVID-19 Vaccine 07/19/2019 12:14 PM 0.5 mL 04/17/2019 Intramuscular   Manufacturer: Moderna   Lot: [id number]   NDC: 73428-768-11

## 2019-07-30 DIAGNOSIS — I119 Hypertensive heart disease without heart failure: Secondary | ICD-10-CM | POA: Diagnosis not present

## 2019-07-30 DIAGNOSIS — N400 Enlarged prostate without lower urinary tract symptoms: Secondary | ICD-10-CM | POA: Diagnosis not present

## 2019-07-30 DIAGNOSIS — J301 Allergic rhinitis due to pollen: Secondary | ICD-10-CM | POA: Diagnosis not present

## 2019-07-30 DIAGNOSIS — E1165 Type 2 diabetes mellitus with hyperglycemia: Secondary | ICD-10-CM | POA: Diagnosis not present

## 2019-08-20 DIAGNOSIS — N400 Enlarged prostate without lower urinary tract symptoms: Secondary | ICD-10-CM | POA: Diagnosis not present

## 2019-08-20 DIAGNOSIS — I119 Hypertensive heart disease without heart failure: Secondary | ICD-10-CM | POA: Diagnosis not present

## 2019-08-20 DIAGNOSIS — N182 Chronic kidney disease, stage 2 (mild): Secondary | ICD-10-CM | POA: Diagnosis not present

## 2019-08-20 DIAGNOSIS — E1165 Type 2 diabetes mellitus with hyperglycemia: Secondary | ICD-10-CM | POA: Diagnosis not present

## 2019-08-21 ENCOUNTER — Ambulatory Visit: Payer: BC Managed Care – PPO | Attending: Family

## 2019-08-21 DIAGNOSIS — Z23 Encounter for immunization: Secondary | ICD-10-CM

## 2019-08-21 NOTE — Progress Notes (Signed)
   Covid-19 Vaccination Clinic  Name:  Daniel Barrera    MRN: [id number] DOB: [birth year]  08/21/2019  Daniel Barrera was observed post Covid-19 immunization for 15 minutes without incident. He was provided with Vaccine Information Sheet and instruction to access the V-Safe system.   Daniel Barrera was instructed to call 911 with any severe reactions post vaccine: Marland Kitchen Difficulty breathing  . Swelling of face and throat  . A fast heartbeat  . A bad rash all over body  . Dizziness and weakness   Immunizations Administered    Name Date Dose VIS Date Route   Moderna COVID-19 Vaccine 08/21/2019 12:25 PM 0.5 mL 04/17/2019 Intramuscular   Manufacturer: Moderna   Lot: [id number]   NDC: 74259-563-87

## 2019-09-12 DIAGNOSIS — F5104 Psychophysiologic insomnia: Secondary | ICD-10-CM | POA: Diagnosis not present

## 2019-09-12 DIAGNOSIS — G4733 Obstructive sleep apnea (adult) (pediatric): Secondary | ICD-10-CM | POA: Diagnosis not present

## 2019-11-26 DIAGNOSIS — I10 Essential (primary) hypertension: Secondary | ICD-10-CM | POA: Diagnosis not present

## 2019-11-26 DIAGNOSIS — N182 Chronic kidney disease, stage 2 (mild): Secondary | ICD-10-CM | POA: Diagnosis not present

## 2019-11-28 DIAGNOSIS — I10 Essential (primary) hypertension: Secondary | ICD-10-CM | POA: Diagnosis not present

## 2019-11-28 DIAGNOSIS — E559 Vitamin D deficiency, unspecified: Secondary | ICD-10-CM | POA: Diagnosis not present

## 2019-11-28 DIAGNOSIS — E119 Type 2 diabetes mellitus without complications: Secondary | ICD-10-CM | POA: Diagnosis not present

## 2019-11-28 DIAGNOSIS — N390 Urinary tract infection, site not specified: Secondary | ICD-10-CM | POA: Diagnosis not present

## 2019-12-07 ENCOUNTER — Inpatient Hospital Stay: Payer: BC Managed Care – PPO | Attending: Hematology & Oncology

## 2019-12-07 ENCOUNTER — Inpatient Hospital Stay: Payer: BC Managed Care – PPO | Admitting: Hematology & Oncology

## 2019-12-10 DIAGNOSIS — Z125 Encounter for screening for malignant neoplasm of prostate: Secondary | ICD-10-CM | POA: Diagnosis not present

## 2019-12-10 DIAGNOSIS — J301 Allergic rhinitis due to pollen: Secondary | ICD-10-CM | POA: Diagnosis not present

## 2019-12-10 DIAGNOSIS — N400 Enlarged prostate without lower urinary tract symptoms: Secondary | ICD-10-CM | POA: Diagnosis not present

## 2019-12-10 DIAGNOSIS — R972 Elevated prostate specific antigen [PSA]: Secondary | ICD-10-CM | POA: Diagnosis not present

## 2019-12-10 DIAGNOSIS — I119 Hypertensive heart disease without heart failure: Secondary | ICD-10-CM | POA: Diagnosis not present

## 2019-12-10 DIAGNOSIS — I10 Essential (primary) hypertension: Secondary | ICD-10-CM | POA: Diagnosis not present

## 2019-12-10 DIAGNOSIS — E1165 Type 2 diabetes mellitus with hyperglycemia: Secondary | ICD-10-CM | POA: Diagnosis not present

## 2019-12-10 DIAGNOSIS — Z0001 Encounter for general adult medical examination with abnormal findings: Secondary | ICD-10-CM | POA: Diagnosis not present

## 2019-12-10 DIAGNOSIS — E785 Hyperlipidemia, unspecified: Secondary | ICD-10-CM | POA: Diagnosis not present

## 2019-12-28 DIAGNOSIS — N401 Enlarged prostate with lower urinary tract symptoms: Secondary | ICD-10-CM | POA: Diagnosis not present

## 2019-12-28 DIAGNOSIS — R350 Frequency of micturition: Secondary | ICD-10-CM | POA: Diagnosis not present

## 2020-01-02 DIAGNOSIS — R972 Elevated prostate specific antigen [PSA]: Secondary | ICD-10-CM | POA: Diagnosis not present

## 2020-01-02 DIAGNOSIS — N3281 Overactive bladder: Secondary | ICD-10-CM | POA: Diagnosis not present

## 2020-01-02 DIAGNOSIS — N5201 Erectile dysfunction due to arterial insufficiency: Secondary | ICD-10-CM | POA: Diagnosis not present

## 2020-01-10 DIAGNOSIS — I10 Essential (primary) hypertension: Secondary | ICD-10-CM | POA: Diagnosis not present

## 2020-01-14 DIAGNOSIS — G4733 Obstructive sleep apnea (adult) (pediatric): Secondary | ICD-10-CM | POA: Diagnosis not present

## 2020-01-14 DIAGNOSIS — Z9989 Dependence on other enabling machines and devices: Secondary | ICD-10-CM | POA: Diagnosis not present

## 2020-01-14 DIAGNOSIS — F5104 Psychophysiologic insomnia: Secondary | ICD-10-CM | POA: Diagnosis not present

## 2020-02-04 DIAGNOSIS — E782 Mixed hyperlipidemia: Secondary | ICD-10-CM | POA: Diagnosis not present

## 2020-02-04 DIAGNOSIS — I119 Hypertensive heart disease without heart failure: Secondary | ICD-10-CM | POA: Diagnosis not present

## 2020-02-04 DIAGNOSIS — E1165 Type 2 diabetes mellitus with hyperglycemia: Secondary | ICD-10-CM | POA: Diagnosis not present

## 2020-02-04 DIAGNOSIS — J301 Allergic rhinitis due to pollen: Secondary | ICD-10-CM | POA: Diagnosis not present

## 2020-02-21 ENCOUNTER — Other Ambulatory Visit: Payer: Self-pay | Admitting: *Deleted

## 2020-02-21 DIAGNOSIS — D573 Sickle-cell trait: Secondary | ICD-10-CM

## 2020-02-21 DIAGNOSIS — D500 Iron deficiency anemia secondary to blood loss (chronic): Secondary | ICD-10-CM

## 2020-02-21 DIAGNOSIS — D72819 Decreased white blood cell count, unspecified: Secondary | ICD-10-CM

## 2020-02-22 ENCOUNTER — Other Ambulatory Visit: Payer: Self-pay

## 2020-02-22 ENCOUNTER — Inpatient Hospital Stay (HOSPITAL_BASED_OUTPATIENT_CLINIC_OR_DEPARTMENT_OTHER): Payer: BC Managed Care – PPO | Admitting: Hematology & Oncology

## 2020-02-22 ENCOUNTER — Inpatient Hospital Stay: Payer: BC Managed Care – PPO | Attending: Hematology & Oncology

## 2020-02-22 ENCOUNTER — Encounter: Payer: Self-pay | Admitting: Hematology & Oncology

## 2020-02-22 VITALS — BP 150/80 | HR 48 | Temp 99.3°F | Resp 18 | Ht 70.0 in | Wt 164.0 lb

## 2020-02-22 DIAGNOSIS — Z79899 Other long term (current) drug therapy: Secondary | ICD-10-CM | POA: Insufficient documentation

## 2020-02-22 DIAGNOSIS — D591 Autoimmune hemolytic anemia, unspecified: Secondary | ICD-10-CM | POA: Insufficient documentation

## 2020-02-22 DIAGNOSIS — D573 Sickle-cell trait: Secondary | ICD-10-CM | POA: Diagnosis not present

## 2020-02-22 DIAGNOSIS — D72819 Decreased white blood cell count, unspecified: Secondary | ICD-10-CM | POA: Diagnosis not present

## 2020-02-22 DIAGNOSIS — D708 Other neutropenia: Secondary | ICD-10-CM

## 2020-02-22 DIAGNOSIS — D500 Iron deficiency anemia secondary to blood loss (chronic): Secondary | ICD-10-CM

## 2020-02-22 LAB — CBC WITH DIFFERENTIAL (CANCER CENTER ONLY)
Abs Immature Granulocytes: 0.02 10*3/uL (ref 0.00–0.07)
Basophils Absolute: 0 10*3/uL (ref 0.0–0.1)
Basophils Relative: 0 %
Eosinophils Absolute: 0 10*3/uL (ref 0.0–0.5)
Eosinophils Relative: 1 %
HCT: 43.4 % (ref 39.0–52.0)
Hemoglobin: 13.8 g/dL (ref 13.0–17.0)
Immature Granulocytes: 1 %
Lymphocytes Relative: 37 %
Lymphs Abs: 1 10*3/uL (ref 0.7–4.0)
MCH: 27.3 pg (ref 26.0–34.0)
MCHC: 31.8 g/dL (ref 30.0–36.0)
MCV: 85.8 fL (ref 80.0–100.0)
Monocytes Absolute: 0.3 10*3/uL (ref 0.1–1.0)
Monocytes Relative: 12 %
Neutro Abs: 1.3 10*3/uL — ABNORMAL LOW (ref 1.7–7.7)
Neutrophils Relative %: 49 %
Platelet Count: 155 10*3/uL (ref 150–400)
RBC: 5.06 MIL/uL (ref 4.22–5.81)
RDW: 12.7 % (ref 11.5–15.5)
WBC Count: 2.7 10*3/uL — ABNORMAL LOW (ref 4.0–10.5)
nRBC: 0 % (ref 0.0–0.2)

## 2020-02-22 LAB — CMP (CANCER CENTER ONLY)
ALT: 27 U/L (ref 0–44)
AST: 20 U/L (ref 15–41)
Albumin: 4.3 g/dL (ref 3.5–5.0)
Alkaline Phosphatase: 64 U/L (ref 38–126)
Anion gap: 8 (ref 5–15)
BUN: 19 mg/dL (ref 6–20)
CO2: 30 mmol/L (ref 22–32)
Calcium: 10.1 mg/dL (ref 8.9–10.3)
Chloride: 105 mmol/L (ref 98–111)
Creatinine: 1.28 mg/dL — ABNORMAL HIGH (ref 0.61–1.24)
GFR, Estimated: >60 mL/min (ref >60)
Glucose, Bld: 156 mg/dL — ABNORMAL HIGH (ref 70–99)
Potassium: 3.6 mmol/L (ref 3.5–5.1)
Sodium: 143 mmol/L (ref 135–145)
Total Bilirubin: 0.4 mg/dL (ref 0.3–1.2)
Total Protein: 8.1 g/dL (ref 6.5–8.1)

## 2020-02-22 LAB — RETICULOCYTES
Immature Retic Fract: 15.8 % (ref 2.3–15.9)
RBC.: 4.95 MIL/uL (ref 4.22–5.81)
Retic Count, Absolute: 55.9 10*3/uL (ref 19.0–186.0)
Retic Ct Pct: 1.1 % (ref 0.4–3.1)

## 2020-02-22 LAB — SAVE SMEAR(SSMR), FOR PROVIDER SLIDE REVIEW

## 2020-02-22 NOTE — Progress Notes (Signed)
Hematology and Oncology Follow Up Visit  Tevon Berhane [id number] 1970/09/08 49 y.o. 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 Glick is here today for his annual follow-up.  The real problem that he has is that his daughter, who is 81 years old has autoimmune hemolytic anemia.  She has been seen at [facility].  She is on prednisone.  I am not sure exactly why she would have autoimmune hemolytic anemia.  I just feel bad for her.  He is doing okay himself.  He occasionally forgets to take the folic acid.  He has had no problems with infections.  He has had no problem with the coronavirus.   There has been no problems with fever.  He has had no rashes.  He has had no change in bowel or bladder habits.  ECOG Performance Status: 0 - Asymptomatic  Medications:  Allergies as of 02/22/2020   No Known Allergies     Medication List       Accurate as of February 22, 2020 12:18 PM. If you have any questions, ask your nurse or doctor.        STOP taking these medications   sodium bicarbonate 650 MG tablet Stopped by: Josph Macho, MD     TAKE these medications   amLODipine-olmesartan 10-40 MG tablet Commonly known as: AZOR Take 1 tablet by mouth daily.   aspirin 81 MG tablet Take 81 mg by mouth. Not taking as directed   Bystolic 10 MG tablet Generic drug: nebivolol Take 1 tablet (10 mg total) by mouth daily.   eplerenone 25 MG tablet Commonly known as: INSPRA Take 1 tablet (25 mg total) by mouth every morning.   folic acid 1 MG tablet Commonly known as: FOLVITE Take 1 tablet (1 mg total) by mouth daily.   MEGA MULTIVITAMIN FOR MEN PO Take by mouth every morning.   tadalafil 10 MG tablet Commonly known as: CIALIS Take 10 mg by mouth 3 times/day as needed-between meals & bedtime.   tamsulosin 0.4 MG Caps  capsule Commonly known as: FLOMAX Take 0.4 mg by mouth daily.       Allergies: No Known Allergies  Past Medical History, Surgical history, Social history, and Family History were reviewed and updated.  Review of Systems: All other 10 point review of systems is negative.   Physical Exam:  height is 5\' 10"  (1.778 m) and weight is 164 lb (74.4 kg). His oral temperature is 99.3 F (37.4 C). His blood pressure is 150/80 (abnormal) and his pulse is 48 (abnormal). His respiration is 18 and oxygen saturation is 100%.   Wt Readings from Last 3 Encounters:  02/22/20 164 lb (74.4 kg)  04/27/19 162 lb (73.5 kg)  12/08/18 164 lb (74.4 kg)    Ocular: Sclerae unicteric, pupils equal, round and reactive to light Ear-nose-throat: Oropharynx clear, dentition fair Lymphatic: No cervical or supraclavicular adenopathy Lungs no rales or rhonchi, good excursion bilaterally Heart regular rate and rhythm, no murmur appreciated Abd soft, nontender, positive bowel sounds, no liver or spleen tip palpated on exam, no fluid wave  MSK no focal spinal tenderness, no joint edema Neuro: non-focal, well-oriented, appropriate affect Breasts: Deferred   Lab Results  Component Value Date   WBC 2.7 (L) 02/22/2020   HGB 13.8 02/22/2020   HCT 43.4 02/22/2020   MCV 85.8 02/22/2020  PLT 155 02/22/2020   Lab Results  Component Value Date   FERRITIN 168 12/08/2018   IRON 78 12/08/2018   TIBC 308 12/08/2018   UIBC 230 12/08/2018   IRONPCTSAT 25 12/08/2018   Lab Results  Component Value Date   RETICCTPCT 1.1 02/22/2020   RBC 4.95 02/22/2020   RETICCTABS 40.8 12/18/2013   No results found for: KPAFRELGTCHN, LAMBDASER, KAPLAMBRATIO No results found for: IGGSERUM, IGA, IGMSERUM No results found for: Marda Stalker, SPEI   Chemistry      Component Value Date/Time   NA 143 02/22/2020 1118   K 3.6 02/22/2020 1118   CL 105 02/22/2020 1118   CO2 30  02/22/2020 1118   BUN 19 02/22/2020 1118   CREATININE 1.28 (H) 02/22/2020 1118      Component Value Date/Time   CALCIUM 10.1 02/22/2020 1118   ALKPHOS 64 02/22/2020 1118   AST 20 02/22/2020 1118   ALT 27 02/22/2020 1118   BILITOT 0.4 02/22/2020 1118       Impression and Plan: Mr. Noyola is a pleasant 49 yo gentleman from [place] with history of ethnic associated leukopenia and sickle cell trait.   He continues to do well and has had no issue with anemia or infections. His counts remain stable.   He will continue the daily folic acid.  I told him that he really needs to take this daily.  We will have him come back in 1 year.  Josph Macho, MD 10/8/202112:18 PM

## 2020-02-25 LAB — FERRITIN: Ferritin: 199 ng/mL (ref 24–336)

## 2020-02-25 LAB — IRON AND TIBC
Iron: 83 ug/dL (ref 42–163)
Saturation Ratios: 26 % (ref 20–55)
TIBC: 316 ug/dL (ref 202–409)
UIBC: 233 ug/dL (ref 117–376)

## 2020-04-21 DIAGNOSIS — E1165 Type 2 diabetes mellitus with hyperglycemia: Secondary | ICD-10-CM | POA: Diagnosis not present

## 2020-04-21 DIAGNOSIS — I119 Hypertensive heart disease without heart failure: Secondary | ICD-10-CM | POA: Diagnosis not present

## 2020-04-21 DIAGNOSIS — E782 Mixed hyperlipidemia: Secondary | ICD-10-CM | POA: Diagnosis not present

## 2020-04-21 DIAGNOSIS — Z23 Encounter for immunization: Secondary | ICD-10-CM | POA: Diagnosis not present

## 2020-04-21 DIAGNOSIS — J301 Allergic rhinitis due to pollen: Secondary | ICD-10-CM | POA: Diagnosis not present

## 2020-04-22 DIAGNOSIS — J301 Allergic rhinitis due to pollen: Secondary | ICD-10-CM | POA: Diagnosis not present

## 2020-04-22 DIAGNOSIS — I119 Hypertensive heart disease without heart failure: Secondary | ICD-10-CM | POA: Diagnosis not present

## 2020-04-22 DIAGNOSIS — E1165 Type 2 diabetes mellitus with hyperglycemia: Secondary | ICD-10-CM | POA: Diagnosis not present

## 2020-04-22 DIAGNOSIS — E782 Mixed hyperlipidemia: Secondary | ICD-10-CM | POA: Diagnosis not present

## 2020-05-26 DIAGNOSIS — R351 Nocturia: Secondary | ICD-10-CM | POA: Diagnosis not present

## 2020-05-26 DIAGNOSIS — N5201 Erectile dysfunction due to arterial insufficiency: Secondary | ICD-10-CM | POA: Diagnosis not present

## 2020-05-26 DIAGNOSIS — N401 Enlarged prostate with lower urinary tract symptoms: Secondary | ICD-10-CM | POA: Diagnosis not present

## 2020-06-11 DIAGNOSIS — I10 Essential (primary) hypertension: Secondary | ICD-10-CM | POA: Diagnosis not present

## 2020-06-13 DIAGNOSIS — N390 Urinary tract infection, site not specified: Secondary | ICD-10-CM | POA: Diagnosis not present

## 2020-06-13 DIAGNOSIS — I10 Essential (primary) hypertension: Secondary | ICD-10-CM | POA: Diagnosis not present

## 2020-06-13 DIAGNOSIS — E119 Type 2 diabetes mellitus without complications: Secondary | ICD-10-CM | POA: Diagnosis not present

## 2020-06-13 DIAGNOSIS — E559 Vitamin D deficiency, unspecified: Secondary | ICD-10-CM | POA: Diagnosis not present

## 2020-07-11 DIAGNOSIS — N401 Enlarged prostate with lower urinary tract symptoms: Secondary | ICD-10-CM | POA: Diagnosis not present

## 2020-07-11 DIAGNOSIS — E119 Type 2 diabetes mellitus without complications: Secondary | ICD-10-CM | POA: Diagnosis not present

## 2020-07-11 DIAGNOSIS — N390 Urinary tract infection, site not specified: Secondary | ICD-10-CM | POA: Diagnosis not present

## 2020-07-11 DIAGNOSIS — E559 Vitamin D deficiency, unspecified: Secondary | ICD-10-CM | POA: Diagnosis not present

## 2020-07-11 DIAGNOSIS — R351 Nocturia: Secondary | ICD-10-CM | POA: Diagnosis not present

## 2020-07-11 DIAGNOSIS — I10 Essential (primary) hypertension: Secondary | ICD-10-CM | POA: Diagnosis not present

## 2020-07-15 ENCOUNTER — Other Ambulatory Visit: Payer: Self-pay | Admitting: Cardiology

## 2020-07-15 DIAGNOSIS — I10 Essential (primary) hypertension: Secondary | ICD-10-CM

## 2020-07-18 DIAGNOSIS — R351 Nocturia: Secondary | ICD-10-CM | POA: Diagnosis not present

## 2020-07-18 DIAGNOSIS — N5201 Erectile dysfunction due to arterial insufficiency: Secondary | ICD-10-CM | POA: Diagnosis not present

## 2020-07-18 DIAGNOSIS — N401 Enlarged prostate with lower urinary tract symptoms: Secondary | ICD-10-CM | POA: Diagnosis not present

## 2020-07-29 DIAGNOSIS — G4733 Obstructive sleep apnea (adult) (pediatric): Secondary | ICD-10-CM | POA: Diagnosis not present

## 2020-08-05 DIAGNOSIS — N183 Chronic kidney disease, stage 3 unspecified: Secondary | ICD-10-CM | POA: Diagnosis not present

## 2020-08-05 DIAGNOSIS — I10 Essential (primary) hypertension: Secondary | ICD-10-CM | POA: Diagnosis not present

## 2020-09-05 DIAGNOSIS — G4733 Obstructive sleep apnea (adult) (pediatric): Secondary | ICD-10-CM | POA: Diagnosis not present

## 2020-09-05 DIAGNOSIS — F5104 Psychophysiologic insomnia: Secondary | ICD-10-CM | POA: Diagnosis not present

## 2020-09-05 DIAGNOSIS — Z9989 Dependence on other enabling machines and devices: Secondary | ICD-10-CM | POA: Diagnosis not present

## 2020-09-08 DIAGNOSIS — E559 Vitamin D deficiency, unspecified: Secondary | ICD-10-CM | POA: Diagnosis not present

## 2020-09-08 DIAGNOSIS — I10 Essential (primary) hypertension: Secondary | ICD-10-CM | POA: Diagnosis not present

## 2020-09-08 DIAGNOSIS — E119 Type 2 diabetes mellitus without complications: Secondary | ICD-10-CM | POA: Diagnosis not present

## 2020-10-03 DIAGNOSIS — N182 Chronic kidney disease, stage 2 (mild): Secondary | ICD-10-CM | POA: Diagnosis not present

## 2020-10-03 DIAGNOSIS — I10 Essential (primary) hypertension: Secondary | ICD-10-CM | POA: Diagnosis not present

## 2020-10-08 DIAGNOSIS — E559 Vitamin D deficiency, unspecified: Secondary | ICD-10-CM | POA: Diagnosis not present

## 2020-10-08 DIAGNOSIS — N390 Urinary tract infection, site not specified: Secondary | ICD-10-CM | POA: Diagnosis not present

## 2020-10-08 DIAGNOSIS — I10 Essential (primary) hypertension: Secondary | ICD-10-CM | POA: Diagnosis not present

## 2020-10-08 DIAGNOSIS — E119 Type 2 diabetes mellitus without complications: Secondary | ICD-10-CM | POA: Diagnosis not present

## 2020-11-05 DIAGNOSIS — Z125 Encounter for screening for malignant neoplasm of prostate: Secondary | ICD-10-CM | POA: Diagnosis not present

## 2020-11-05 DIAGNOSIS — Z1389 Encounter for screening for other disorder: Secondary | ICD-10-CM | POA: Diagnosis not present

## 2020-11-05 DIAGNOSIS — Z136 Encounter for screening for cardiovascular disorders: Secondary | ICD-10-CM | POA: Diagnosis not present

## 2020-11-05 DIAGNOSIS — E782 Mixed hyperlipidemia: Secondary | ICD-10-CM | POA: Diagnosis not present

## 2020-11-05 DIAGNOSIS — I10 Essential (primary) hypertension: Secondary | ICD-10-CM | POA: Diagnosis not present

## 2020-11-05 DIAGNOSIS — E1165 Type 2 diabetes mellitus with hyperglycemia: Secondary | ICD-10-CM | POA: Diagnosis not present

## 2020-11-05 DIAGNOSIS — J301 Allergic rhinitis due to pollen: Secondary | ICD-10-CM | POA: Diagnosis not present

## 2020-11-05 DIAGNOSIS — Z0001 Encounter for general adult medical examination with abnormal findings: Secondary | ICD-10-CM | POA: Diagnosis not present

## 2020-11-12 DIAGNOSIS — I10 Essential (primary) hypertension: Secondary | ICD-10-CM | POA: Diagnosis not present

## 2020-11-12 DIAGNOSIS — E782 Mixed hyperlipidemia: Secondary | ICD-10-CM | POA: Diagnosis not present

## 2020-11-12 DIAGNOSIS — E1165 Type 2 diabetes mellitus with hyperglycemia: Secondary | ICD-10-CM | POA: Diagnosis not present

## 2020-11-12 DIAGNOSIS — R609 Edema, unspecified: Secondary | ICD-10-CM | POA: Diagnosis not present

## 2020-11-20 DIAGNOSIS — E119 Type 2 diabetes mellitus without complications: Secondary | ICD-10-CM | POA: Diagnosis not present

## 2020-11-20 DIAGNOSIS — N390 Urinary tract infection, site not specified: Secondary | ICD-10-CM | POA: Diagnosis not present

## 2020-11-20 DIAGNOSIS — E559 Vitamin D deficiency, unspecified: Secondary | ICD-10-CM | POA: Diagnosis not present

## 2020-11-20 DIAGNOSIS — I10 Essential (primary) hypertension: Secondary | ICD-10-CM | POA: Diagnosis not present

## 2020-12-22 DIAGNOSIS — E782 Mixed hyperlipidemia: Secondary | ICD-10-CM | POA: Diagnosis not present

## 2020-12-22 DIAGNOSIS — I10 Essential (primary) hypertension: Secondary | ICD-10-CM | POA: Diagnosis not present

## 2020-12-22 DIAGNOSIS — J301 Allergic rhinitis due to pollen: Secondary | ICD-10-CM | POA: Diagnosis not present

## 2020-12-22 DIAGNOSIS — E1165 Type 2 diabetes mellitus with hyperglycemia: Secondary | ICD-10-CM | POA: Diagnosis not present

## 2020-12-23 DIAGNOSIS — E119 Type 2 diabetes mellitus without complications: Secondary | ICD-10-CM | POA: Diagnosis not present

## 2020-12-23 DIAGNOSIS — N390 Urinary tract infection, site not specified: Secondary | ICD-10-CM | POA: Diagnosis not present

## 2020-12-23 DIAGNOSIS — I10 Essential (primary) hypertension: Secondary | ICD-10-CM | POA: Diagnosis not present

## 2020-12-23 DIAGNOSIS — E559 Vitamin D deficiency, unspecified: Secondary | ICD-10-CM | POA: Diagnosis not present

## 2021-01-21 DIAGNOSIS — R351 Nocturia: Secondary | ICD-10-CM | POA: Diagnosis not present

## 2021-01-21 DIAGNOSIS — R972 Elevated prostate specific antigen [PSA]: Secondary | ICD-10-CM | POA: Diagnosis not present

## 2021-01-21 DIAGNOSIS — N401 Enlarged prostate with lower urinary tract symptoms: Secondary | ICD-10-CM | POA: Diagnosis not present

## 2021-02-20 ENCOUNTER — Inpatient Hospital Stay: Payer: BC Managed Care – PPO

## 2021-02-20 ENCOUNTER — Inpatient Hospital Stay: Payer: BC Managed Care – PPO | Attending: Hematology & Oncology | Admitting: Hematology & Oncology

## 2021-03-09 DIAGNOSIS — I10 Essential (primary) hypertension: Secondary | ICD-10-CM | POA: Diagnosis not present

## 2021-03-09 DIAGNOSIS — E782 Mixed hyperlipidemia: Secondary | ICD-10-CM | POA: Diagnosis not present

## 2021-03-09 DIAGNOSIS — E1165 Type 2 diabetes mellitus with hyperglycemia: Secondary | ICD-10-CM | POA: Diagnosis not present

## 2021-03-09 DIAGNOSIS — J301 Allergic rhinitis due to pollen: Secondary | ICD-10-CM | POA: Diagnosis not present

## 2021-04-28 DIAGNOSIS — E1165 Type 2 diabetes mellitus with hyperglycemia: Secondary | ICD-10-CM | POA: Diagnosis not present

## 2021-04-28 DIAGNOSIS — E782 Mixed hyperlipidemia: Secondary | ICD-10-CM | POA: Diagnosis not present

## 2021-04-28 DIAGNOSIS — N182 Chronic kidney disease, stage 2 (mild): Secondary | ICD-10-CM | POA: Diagnosis not present

## 2021-04-28 DIAGNOSIS — I10 Essential (primary) hypertension: Secondary | ICD-10-CM | POA: Diagnosis not present

## 2021-05-08 DIAGNOSIS — R059 Cough, unspecified: Secondary | ICD-10-CM | POA: Diagnosis not present

## 2021-05-08 DIAGNOSIS — R509 Fever, unspecified: Secondary | ICD-10-CM | POA: Diagnosis not present

## 2021-05-08 DIAGNOSIS — U071 COVID-19: Secondary | ICD-10-CM | POA: Diagnosis not present

## 2021-05-08 DIAGNOSIS — R112 Nausea with vomiting, unspecified: Secondary | ICD-10-CM | POA: Diagnosis not present

## 2021-05-19 DIAGNOSIS — Z20822 Contact with and (suspected) exposure to covid-19: Secondary | ICD-10-CM | POA: Diagnosis not present

## 2021-05-19 DIAGNOSIS — Z6824 Body mass index (BMI) 24.0-24.9, adult: Secondary | ICD-10-CM | POA: Diagnosis not present

## 2021-05-19 DIAGNOSIS — U071 COVID-19: Secondary | ICD-10-CM | POA: Diagnosis not present

## 2021-07-14 DIAGNOSIS — E876 Hypokalemia: Secondary | ICD-10-CM | POA: Diagnosis not present

## 2021-07-14 DIAGNOSIS — N182 Chronic kidney disease, stage 2 (mild): Secondary | ICD-10-CM | POA: Diagnosis not present

## 2021-07-14 DIAGNOSIS — N390 Urinary tract infection, site not specified: Secondary | ICD-10-CM | POA: Diagnosis not present

## 2021-07-14 DIAGNOSIS — G4733 Obstructive sleep apnea (adult) (pediatric): Secondary | ICD-10-CM | POA: Diagnosis not present

## 2021-07-14 DIAGNOSIS — E119 Type 2 diabetes mellitus without complications: Secondary | ICD-10-CM | POA: Diagnosis not present

## 2021-08-19 DIAGNOSIS — R5383 Other fatigue: Secondary | ICD-10-CM | POA: Diagnosis not present

## 2021-08-19 DIAGNOSIS — Z1152 Encounter for screening for COVID-19: Secondary | ICD-10-CM | POA: Diagnosis not present

## 2021-08-19 DIAGNOSIS — E559 Vitamin D deficiency, unspecified: Secondary | ICD-10-CM | POA: Diagnosis not present

## 2021-08-19 DIAGNOSIS — B349 Viral infection, unspecified: Secondary | ICD-10-CM | POA: Diagnosis not present

## 2021-08-19 DIAGNOSIS — E1165 Type 2 diabetes mellitus with hyperglycemia: Secondary | ICD-10-CM | POA: Diagnosis not present

## 2021-08-19 DIAGNOSIS — G4733 Obstructive sleep apnea (adult) (pediatric): Secondary | ICD-10-CM | POA: Diagnosis not present

## 2021-09-18 DIAGNOSIS — G4733 Obstructive sleep apnea (adult) (pediatric): Secondary | ICD-10-CM | POA: Diagnosis not present

## 2021-10-19 DIAGNOSIS — G4733 Obstructive sleep apnea (adult) (pediatric): Secondary | ICD-10-CM | POA: Diagnosis not present

## 2021-11-09 DIAGNOSIS — E1165 Type 2 diabetes mellitus with hyperglycemia: Secondary | ICD-10-CM | POA: Diagnosis not present

## 2021-11-09 DIAGNOSIS — I10 Essential (primary) hypertension: Secondary | ICD-10-CM | POA: Diagnosis not present

## 2021-11-09 DIAGNOSIS — Z125 Encounter for screening for malignant neoplasm of prostate: Secondary | ICD-10-CM | POA: Diagnosis not present

## 2021-11-09 DIAGNOSIS — Z0001 Encounter for general adult medical examination with abnormal findings: Secondary | ICD-10-CM | POA: Diagnosis not present

## 2021-11-09 DIAGNOSIS — N182 Chronic kidney disease, stage 2 (mild): Secondary | ICD-10-CM | POA: Diagnosis not present

## 2021-11-09 DIAGNOSIS — E782 Mixed hyperlipidemia: Secondary | ICD-10-CM | POA: Diagnosis not present

## 2021-11-18 DIAGNOSIS — G4733 Obstructive sleep apnea (adult) (pediatric): Secondary | ICD-10-CM | POA: Diagnosis not present

## 2021-12-03 DIAGNOSIS — I10 Essential (primary) hypertension: Secondary | ICD-10-CM | POA: Diagnosis not present

## 2021-12-03 DIAGNOSIS — E1165 Type 2 diabetes mellitus with hyperglycemia: Secondary | ICD-10-CM | POA: Diagnosis not present

## 2021-12-03 DIAGNOSIS — N182 Chronic kidney disease, stage 2 (mild): Secondary | ICD-10-CM | POA: Diagnosis not present

## 2021-12-03 DIAGNOSIS — E782 Mixed hyperlipidemia: Secondary | ICD-10-CM | POA: Diagnosis not present

## 2021-12-03 DIAGNOSIS — G4733 Obstructive sleep apnea (adult) (pediatric): Secondary | ICD-10-CM | POA: Diagnosis not present

## 2021-12-19 DIAGNOSIS — G4733 Obstructive sleep apnea (adult) (pediatric): Secondary | ICD-10-CM | POA: Diagnosis not present

## 2022-01-19 DIAGNOSIS — G4733 Obstructive sleep apnea (adult) (pediatric): Secondary | ICD-10-CM | POA: Diagnosis not present

## 2022-02-09 DIAGNOSIS — I10 Essential (primary) hypertension: Secondary | ICD-10-CM | POA: Diagnosis not present

## 2022-02-09 DIAGNOSIS — N182 Chronic kidney disease, stage 2 (mild): Secondary | ICD-10-CM | POA: Diagnosis not present

## 2022-02-11 DIAGNOSIS — N182 Chronic kidney disease, stage 2 (mild): Secondary | ICD-10-CM | POA: Diagnosis not present

## 2022-02-11 DIAGNOSIS — I10 Essential (primary) hypertension: Secondary | ICD-10-CM | POA: Diagnosis not present

## 2022-02-11 DIAGNOSIS — E1165 Type 2 diabetes mellitus with hyperglycemia: Secondary | ICD-10-CM | POA: Diagnosis not present

## 2022-02-11 DIAGNOSIS — E785 Hyperlipidemia, unspecified: Secondary | ICD-10-CM | POA: Diagnosis not present

## 2022-02-11 DIAGNOSIS — N390 Urinary tract infection, site not specified: Secondary | ICD-10-CM | POA: Diagnosis not present

## 2022-02-18 DIAGNOSIS — G4733 Obstructive sleep apnea (adult) (pediatric): Secondary | ICD-10-CM | POA: Diagnosis not present

## 2022-03-21 DIAGNOSIS — G4733 Obstructive sleep apnea (adult) (pediatric): Secondary | ICD-10-CM | POA: Diagnosis not present

## 2022-04-26 DIAGNOSIS — E1165 Type 2 diabetes mellitus with hyperglycemia: Secondary | ICD-10-CM | POA: Diagnosis not present

## 2022-04-26 DIAGNOSIS — I10 Essential (primary) hypertension: Secondary | ICD-10-CM | POA: Diagnosis not present

## 2022-04-26 DIAGNOSIS — N182 Chronic kidney disease, stage 2 (mild): Secondary | ICD-10-CM | POA: Diagnosis not present

## 2022-04-26 DIAGNOSIS — E782 Mixed hyperlipidemia: Secondary | ICD-10-CM | POA: Diagnosis not present

## 2022-08-05 DIAGNOSIS — N182 Chronic kidney disease, stage 2 (mild): Secondary | ICD-10-CM | POA: Diagnosis not present

## 2022-08-05 DIAGNOSIS — I10 Essential (primary) hypertension: Secondary | ICD-10-CM | POA: Diagnosis not present

## 2022-08-06 DIAGNOSIS — N182 Chronic kidney disease, stage 2 (mild): Secondary | ICD-10-CM | POA: Diagnosis not present

## 2022-08-06 DIAGNOSIS — E1165 Type 2 diabetes mellitus with hyperglycemia: Secondary | ICD-10-CM | POA: Diagnosis not present

## 2022-08-06 DIAGNOSIS — N390 Urinary tract infection, site not specified: Secondary | ICD-10-CM | POA: Diagnosis not present

## 2022-08-06 DIAGNOSIS — E785 Hyperlipidemia, unspecified: Secondary | ICD-10-CM | POA: Diagnosis not present

## 2022-08-06 DIAGNOSIS — E876 Hypokalemia: Secondary | ICD-10-CM | POA: Diagnosis not present

## 2022-08-16 DIAGNOSIS — R809 Proteinuria, unspecified: Secondary | ICD-10-CM | POA: Diagnosis not present

## 2022-08-16 DIAGNOSIS — I10 Essential (primary) hypertension: Secondary | ICD-10-CM | POA: Diagnosis not present

## 2022-09-23 DIAGNOSIS — N182 Chronic kidney disease, stage 2 (mild): Secondary | ICD-10-CM | POA: Diagnosis not present

## 2022-09-24 DIAGNOSIS — E1165 Type 2 diabetes mellitus with hyperglycemia: Secondary | ICD-10-CM | POA: Diagnosis not present

## 2022-09-24 DIAGNOSIS — I10 Essential (primary) hypertension: Secondary | ICD-10-CM | POA: Diagnosis not present

## 2022-09-24 DIAGNOSIS — N390 Urinary tract infection, site not specified: Secondary | ICD-10-CM | POA: Diagnosis not present

## 2022-09-24 DIAGNOSIS — E785 Hyperlipidemia, unspecified: Secondary | ICD-10-CM | POA: Diagnosis not present

## 2022-09-24 DIAGNOSIS — N182 Chronic kidney disease, stage 2 (mild): Secondary | ICD-10-CM | POA: Diagnosis not present

## 2022-12-02 DIAGNOSIS — Z125 Encounter for screening for malignant neoplasm of prostate: Secondary | ICD-10-CM | POA: Diagnosis not present

## 2022-12-02 DIAGNOSIS — Z0001 Encounter for general adult medical examination with abnormal findings: Secondary | ICD-10-CM | POA: Diagnosis not present

## 2022-12-02 DIAGNOSIS — E1165 Type 2 diabetes mellitus with hyperglycemia: Secondary | ICD-10-CM | POA: Diagnosis not present

## 2022-12-02 DIAGNOSIS — I10 Essential (primary) hypertension: Secondary | ICD-10-CM | POA: Diagnosis not present

## 2022-12-02 DIAGNOSIS — N522 Drug-induced erectile dysfunction: Secondary | ICD-10-CM | POA: Diagnosis not present

## 2022-12-02 DIAGNOSIS — E782 Mixed hyperlipidemia: Secondary | ICD-10-CM | POA: Diagnosis not present

## 2022-12-10 DIAGNOSIS — E559 Vitamin D deficiency, unspecified: Secondary | ICD-10-CM | POA: Diagnosis not present

## 2022-12-10 DIAGNOSIS — E1165 Type 2 diabetes mellitus with hyperglycemia: Secondary | ICD-10-CM | POA: Diagnosis not present

## 2022-12-10 DIAGNOSIS — E782 Mixed hyperlipidemia: Secondary | ICD-10-CM | POA: Diagnosis not present

## 2022-12-10 DIAGNOSIS — Z0001 Encounter for general adult medical examination with abnormal findings: Secondary | ICD-10-CM | POA: Diagnosis not present

## 2022-12-10 DIAGNOSIS — I10 Essential (primary) hypertension: Secondary | ICD-10-CM | POA: Diagnosis not present

## 2022-12-16 DIAGNOSIS — R809 Proteinuria, unspecified: Secondary | ICD-10-CM | POA: Diagnosis not present

## 2022-12-16 DIAGNOSIS — R051 Acute cough: Secondary | ICD-10-CM | POA: Diagnosis not present

## 2022-12-16 DIAGNOSIS — E1129 Type 2 diabetes mellitus with other diabetic kidney complication: Secondary | ICD-10-CM | POA: Diagnosis not present

## 2022-12-16 DIAGNOSIS — J208 Acute bronchitis due to other specified organisms: Secondary | ICD-10-CM | POA: Diagnosis not present

## 2022-12-22 DIAGNOSIS — G4733 Obstructive sleep apnea (adult) (pediatric): Secondary | ICD-10-CM | POA: Diagnosis not present

## 2023-02-17 DIAGNOSIS — R351 Nocturia: Secondary | ICD-10-CM | POA: Diagnosis not present

## 2023-02-17 DIAGNOSIS — R350 Frequency of micturition: Secondary | ICD-10-CM | POA: Diagnosis not present

## 2023-02-17 DIAGNOSIS — R3915 Urgency of urination: Secondary | ICD-10-CM | POA: Diagnosis not present

## 2023-03-24 DIAGNOSIS — I10 Essential (primary) hypertension: Secondary | ICD-10-CM | POA: Diagnosis not present

## 2023-03-24 DIAGNOSIS — E1122 Type 2 diabetes mellitus with diabetic chronic kidney disease: Secondary | ICD-10-CM | POA: Diagnosis not present

## 2023-03-24 DIAGNOSIS — N182 Chronic kidney disease, stage 2 (mild): Secondary | ICD-10-CM | POA: Diagnosis not present

## 2023-03-24 DIAGNOSIS — R809 Proteinuria, unspecified: Secondary | ICD-10-CM | POA: Diagnosis not present

## 2023-03-24 DIAGNOSIS — E1129 Type 2 diabetes mellitus with other diabetic kidney complication: Secondary | ICD-10-CM | POA: Diagnosis not present

## 2023-03-25 DIAGNOSIS — I2510 Atherosclerotic heart disease of native coronary artery without angina pectoris: Secondary | ICD-10-CM | POA: Diagnosis not present

## 2023-03-25 DIAGNOSIS — E119 Type 2 diabetes mellitus without complications: Secondary | ICD-10-CM | POA: Diagnosis not present

## 2023-03-25 DIAGNOSIS — E876 Hypokalemia: Secondary | ICD-10-CM | POA: Diagnosis not present

## 2023-03-25 DIAGNOSIS — N182 Chronic kidney disease, stage 2 (mild): Secondary | ICD-10-CM | POA: Diagnosis not present

## 2023-03-25 DIAGNOSIS — N390 Urinary tract infection, site not specified: Secondary | ICD-10-CM | POA: Diagnosis not present

## 2023-05-02 DIAGNOSIS — E1122 Type 2 diabetes mellitus with diabetic chronic kidney disease: Secondary | ICD-10-CM | POA: Diagnosis not present

## 2023-05-02 DIAGNOSIS — I10 Essential (primary) hypertension: Secondary | ICD-10-CM | POA: Diagnosis not present

## 2023-05-02 DIAGNOSIS — E1129 Type 2 diabetes mellitus with other diabetic kidney complication: Secondary | ICD-10-CM | POA: Diagnosis not present

## 2023-05-02 DIAGNOSIS — E782 Mixed hyperlipidemia: Secondary | ICD-10-CM | POA: Diagnosis not present

## 2023-05-05 DIAGNOSIS — N1831 Chronic kidney disease, stage 3a: Secondary | ICD-10-CM | POA: Diagnosis not present

## 2023-05-05 DIAGNOSIS — E1129 Type 2 diabetes mellitus with other diabetic kidney complication: Secondary | ICD-10-CM | POA: Diagnosis not present

## 2023-05-05 DIAGNOSIS — E1122 Type 2 diabetes mellitus with diabetic chronic kidney disease: Secondary | ICD-10-CM | POA: Diagnosis not present

## 2023-05-05 DIAGNOSIS — R809 Proteinuria, unspecified: Secondary | ICD-10-CM | POA: Diagnosis not present

## 2023-05-16 DIAGNOSIS — G4733 Obstructive sleep apnea (adult) (pediatric): Secondary | ICD-10-CM | POA: Diagnosis not present

## 2023-06-16 DIAGNOSIS — R351 Nocturia: Secondary | ICD-10-CM | POA: Diagnosis not present

## 2023-06-16 DIAGNOSIS — N401 Enlarged prostate with lower urinary tract symptoms: Secondary | ICD-10-CM | POA: Diagnosis not present

## 2023-08-15 DIAGNOSIS — G4733 Obstructive sleep apnea (adult) (pediatric): Secondary | ICD-10-CM | POA: Diagnosis not present

## 2023-08-30 DIAGNOSIS — U071 COVID-19: Secondary | ICD-10-CM | POA: Diagnosis not present

## 2023-08-30 DIAGNOSIS — J111 Influenza due to unidentified influenza virus with other respiratory manifestations: Secondary | ICD-10-CM | POA: Diagnosis not present

## 2023-09-21 DIAGNOSIS — U099 Post covid-19 condition, unspecified: Secondary | ICD-10-CM | POA: Diagnosis not present

## 2023-09-21 DIAGNOSIS — R5383 Other fatigue: Secondary | ICD-10-CM | POA: Diagnosis not present

## 2023-09-21 DIAGNOSIS — R531 Weakness: Secondary | ICD-10-CM | POA: Diagnosis not present

## 2023-09-22 DIAGNOSIS — N182 Chronic kidney disease, stage 2 (mild): Secondary | ICD-10-CM | POA: Diagnosis not present

## 2023-09-22 DIAGNOSIS — I10 Essential (primary) hypertension: Secondary | ICD-10-CM | POA: Diagnosis not present

## 2023-09-23 DIAGNOSIS — N390 Urinary tract infection, site not specified: Secondary | ICD-10-CM | POA: Diagnosis not present

## 2023-09-23 DIAGNOSIS — N182 Chronic kidney disease, stage 2 (mild): Secondary | ICD-10-CM | POA: Diagnosis not present

## 2023-09-23 DIAGNOSIS — E876 Hypokalemia: Secondary | ICD-10-CM | POA: Diagnosis not present

## 2023-09-23 DIAGNOSIS — E119 Type 2 diabetes mellitus without complications: Secondary | ICD-10-CM | POA: Diagnosis not present

## 2023-09-23 DIAGNOSIS — I10 Essential (primary) hypertension: Secondary | ICD-10-CM | POA: Diagnosis not present

## 2023-09-26 DIAGNOSIS — R351 Nocturia: Secondary | ICD-10-CM | POA: Diagnosis not present

## 2023-09-26 DIAGNOSIS — N401 Enlarged prostate with lower urinary tract symptoms: Secondary | ICD-10-CM | POA: Diagnosis not present

## 2023-09-26 DIAGNOSIS — R972 Elevated prostate specific antigen [PSA]: Secondary | ICD-10-CM | POA: Diagnosis not present

## 2023-10-11 DIAGNOSIS — N452 Orchitis: Secondary | ICD-10-CM | POA: Diagnosis not present

## 2023-10-11 DIAGNOSIS — N50812 Left testicular pain: Secondary | ICD-10-CM | POA: Diagnosis not present

## 2023-10-12 ENCOUNTER — Other Ambulatory Visit: Payer: Self-pay | Admitting: Internal Medicine

## 2023-10-12 DIAGNOSIS — N5082 Scrotal pain: Secondary | ICD-10-CM

## 2023-10-17 ENCOUNTER — Ambulatory Visit
Admission: RE | Admit: 2023-10-17 | Discharge: 2023-10-17 | Disposition: A | Discharge status: Home / Self Care | Source: Ambulatory Visit | Attending: Internal Medicine | Admitting: Internal Medicine

## 2023-10-17 DIAGNOSIS — N5082 Scrotal pain: Secondary | ICD-10-CM

## 2023-10-17 DIAGNOSIS — N433 Hydrocele, unspecified: Secondary | ICD-10-CM | POA: Diagnosis not present

## 2023-10-17 DIAGNOSIS — N451 Epididymitis: Secondary | ICD-10-CM | POA: Diagnosis not present

## 2023-10-18 ENCOUNTER — Other Ambulatory Visit: Payer: Self-pay | Admitting: Urology

## 2023-10-18 DIAGNOSIS — R972 Elevated prostate specific antigen [PSA]: Secondary | ICD-10-CM

## 2023-10-27 DIAGNOSIS — Z0001 Encounter for general adult medical examination with abnormal findings: Secondary | ICD-10-CM | POA: Diagnosis not present

## 2023-10-27 DIAGNOSIS — N1831 Chronic kidney disease, stage 3a: Secondary | ICD-10-CM | POA: Diagnosis not present

## 2023-10-27 DIAGNOSIS — M62830 Muscle spasm of back: Secondary | ICD-10-CM | POA: Diagnosis not present

## 2023-10-27 DIAGNOSIS — R809 Proteinuria, unspecified: Secondary | ICD-10-CM | POA: Diagnosis not present

## 2023-10-27 DIAGNOSIS — E1122 Type 2 diabetes mellitus with diabetic chronic kidney disease: Secondary | ICD-10-CM | POA: Diagnosis not present

## 2023-10-27 DIAGNOSIS — I10 Essential (primary) hypertension: Secondary | ICD-10-CM | POA: Diagnosis not present

## 2023-10-27 DIAGNOSIS — N400 Enlarged prostate without lower urinary tract symptoms: Secondary | ICD-10-CM | POA: Diagnosis not present

## 2023-10-27 DIAGNOSIS — E1129 Type 2 diabetes mellitus with other diabetic kidney complication: Secondary | ICD-10-CM | POA: Diagnosis not present

## 2023-10-27 DIAGNOSIS — D509 Iron deficiency anemia, unspecified: Secondary | ICD-10-CM | POA: Diagnosis not present

## 2023-10-31 ENCOUNTER — Encounter: Payer: Self-pay | Admitting: Urology

## 2023-11-23 ENCOUNTER — Ambulatory Visit
Admission: RE | Admit: 2023-11-23 | Discharge: 2023-11-23 | Disposition: A | Discharge status: Home / Self Care | Source: Ambulatory Visit | Attending: Urology | Admitting: Urology

## 2023-11-23 DIAGNOSIS — E1122 Type 2 diabetes mellitus with diabetic chronic kidney disease: Secondary | ICD-10-CM | POA: Diagnosis not present

## 2023-11-23 DIAGNOSIS — N1831 Chronic kidney disease, stage 3a: Secondary | ICD-10-CM | POA: Diagnosis not present

## 2023-11-23 DIAGNOSIS — E1129 Type 2 diabetes mellitus with other diabetic kidney complication: Secondary | ICD-10-CM | POA: Diagnosis not present

## 2023-11-23 DIAGNOSIS — R972 Elevated prostate specific antigen [PSA]: Secondary | ICD-10-CM

## 2023-11-23 DIAGNOSIS — R809 Proteinuria, unspecified: Secondary | ICD-10-CM | POA: Diagnosis not present

## 2023-11-23 DIAGNOSIS — N4289 Other specified disorders of prostate: Secondary | ICD-10-CM | POA: Diagnosis not present

## 2023-11-23 MED ORDER — GADOPICLENOL 0.5 MMOL/ML IV SOLN
7.0000 mL | Freq: Once | INTRAVENOUS | Status: AC | PRN
  Administered 2023-11-23: 7 mL via INTRAVENOUS

## 2023-12-26 DIAGNOSIS — R3915 Urgency of urination: Secondary | ICD-10-CM | POA: Diagnosis not present

## 2023-12-26 DIAGNOSIS — N401 Enlarged prostate with lower urinary tract symptoms: Secondary | ICD-10-CM | POA: Diagnosis not present

## 2023-12-26 DIAGNOSIS — R972 Elevated prostate specific antigen [PSA]: Secondary | ICD-10-CM | POA: Diagnosis not present

## 2024-01-30 DIAGNOSIS — R972 Elevated prostate specific antigen [PSA]: Secondary | ICD-10-CM | POA: Diagnosis not present

## 2024-02-13 DIAGNOSIS — R972 Elevated prostate specific antigen [PSA]: Secondary | ICD-10-CM | POA: Diagnosis not present

## 2024-02-13 DIAGNOSIS — N5201 Erectile dysfunction due to arterial insufficiency: Secondary | ICD-10-CM | POA: Diagnosis not present

## 2024-02-13 DIAGNOSIS — N401 Enlarged prostate with lower urinary tract symptoms: Secondary | ICD-10-CM | POA: Diagnosis not present

## 2024-02-13 DIAGNOSIS — R351 Nocturia: Secondary | ICD-10-CM | POA: Diagnosis not present

## 2024-03-02 DIAGNOSIS — N183 Chronic kidney disease, stage 3 unspecified: Secondary | ICD-10-CM | POA: Diagnosis not present

## 2024-03-02 DIAGNOSIS — E118 Type 2 diabetes mellitus with unspecified complications: Secondary | ICD-10-CM | POA: Diagnosis not present

## 2024-03-02 DIAGNOSIS — E870 Hyperosmolality and hypernatremia: Secondary | ICD-10-CM | POA: Diagnosis not present

## 2024-03-02 DIAGNOSIS — N390 Urinary tract infection, site not specified: Secondary | ICD-10-CM | POA: Diagnosis not present

## 2024-03-02 DIAGNOSIS — E876 Hypokalemia: Secondary | ICD-10-CM | POA: Diagnosis not present

## 2024-03-09 DIAGNOSIS — N183 Chronic kidney disease, stage 3 unspecified: Secondary | ICD-10-CM | POA: Diagnosis not present

## 2024-03-09 DIAGNOSIS — I10 Essential (primary) hypertension: Secondary | ICD-10-CM | POA: Diagnosis not present

## 2024-03-09 DIAGNOSIS — N281 Cyst of kidney, acquired: Secondary | ICD-10-CM | POA: Diagnosis not present

## 2024-03-09 DIAGNOSIS — I1310 Hypertensive heart and chronic kidney disease without heart failure, with stage 1 through stage 4 chronic kidney disease, or unspecified chronic kidney disease: Secondary | ICD-10-CM | POA: Diagnosis not present

## 2024-03-21 DIAGNOSIS — I10 Essential (primary) hypertension: Secondary | ICD-10-CM | POA: Diagnosis not present

## 2024-03-21 DIAGNOSIS — R809 Proteinuria, unspecified: Secondary | ICD-10-CM | POA: Diagnosis not present

## 2024-03-26 DIAGNOSIS — N182 Chronic kidney disease, stage 2 (mild): Secondary | ICD-10-CM | POA: Diagnosis not present

## 2024-03-26 DIAGNOSIS — E876 Hypokalemia: Secondary | ICD-10-CM | POA: Diagnosis not present

## 2024-03-30 DIAGNOSIS — N183 Chronic kidney disease, stage 3 unspecified: Secondary | ICD-10-CM | POA: Diagnosis not present

## 2024-03-30 DIAGNOSIS — I10 Essential (primary) hypertension: Secondary | ICD-10-CM | POA: Diagnosis not present

## 2024-03-30 DIAGNOSIS — E119 Type 2 diabetes mellitus without complications: Secondary | ICD-10-CM | POA: Diagnosis not present

## 2024-03-30 DIAGNOSIS — N390 Urinary tract infection, site not specified: Secondary | ICD-10-CM | POA: Diagnosis not present

## 2024-03-30 DIAGNOSIS — G4733 Obstructive sleep apnea (adult) (pediatric): Secondary | ICD-10-CM | POA: Diagnosis not present

## 2024-05-07 DIAGNOSIS — E876 Hypokalemia: Secondary | ICD-10-CM | POA: Diagnosis not present

## 2024-05-07 DIAGNOSIS — N183 Chronic kidney disease, stage 3 unspecified: Secondary | ICD-10-CM | POA: Diagnosis not present

## 2024-05-08 DIAGNOSIS — I129 Hypertensive chronic kidney disease with stage 1 through stage 4 chronic kidney disease, or unspecified chronic kidney disease: Secondary | ICD-10-CM | POA: Diagnosis not present

## 2024-05-08 DIAGNOSIS — N183 Chronic kidney disease, stage 3 unspecified: Secondary | ICD-10-CM | POA: Diagnosis not present

## 2024-05-08 DIAGNOSIS — N269 Renal sclerosis, unspecified: Secondary | ICD-10-CM | POA: Diagnosis not present

## 2024-05-08 DIAGNOSIS — N261 Atrophy of kidney (terminal): Secondary | ICD-10-CM | POA: Diagnosis not present

## 2024-05-08 DIAGNOSIS — N048 Nephrotic syndrome with other morphologic changes: Secondary | ICD-10-CM | POA: Diagnosis not present
